# Patient Record
Sex: Male | Born: 1985 | Race: Black or African American | Hispanic: No | Marital: Single | Smoking: Current every day smoker
Health system: Southern US, Community
[De-identification: ages and names within clinical notes are randomized; demographics above are authoritative.]

---

## 2004-10-04 ENCOUNTER — Emergency Department: Payer: Self-pay | Admitting: Internal Medicine

## 2005-05-25 ENCOUNTER — Emergency Department: Payer: Self-pay | Admitting: Emergency Medicine

## 2006-03-03 ENCOUNTER — Emergency Department: Payer: Self-pay | Admitting: Emergency Medicine

## 2006-08-27 ENCOUNTER — Emergency Department: Payer: Self-pay | Admitting: Emergency Medicine

## 2007-05-17 ENCOUNTER — Emergency Department: Payer: Self-pay | Admitting: Emergency Medicine

## 2009-05-17 ENCOUNTER — Emergency Department: Payer: Self-pay | Admitting: Emergency Medicine

## 2011-06-06 ENCOUNTER — Emergency Department: Payer: Self-pay | Admitting: Emergency Medicine

## 2011-10-22 ENCOUNTER — Emergency Department: Payer: Self-pay | Admitting: Emergency Medicine

## 2011-12-17 ENCOUNTER — Emergency Department: Payer: Self-pay | Admitting: Emergency Medicine

## 2012-11-04 ENCOUNTER — Emergency Department: Payer: Self-pay | Admitting: Emergency Medicine

## 2013-04-11 ENCOUNTER — Emergency Department: Payer: Self-pay | Admitting: Emergency Medicine

## 2014-01-10 ENCOUNTER — Emergency Department: Payer: Self-pay | Admitting: Emergency Medicine

## 2014-01-25 ENCOUNTER — Emergency Department: Payer: Self-pay | Admitting: Emergency Medicine

## 2014-06-14 ENCOUNTER — Emergency Department: Payer: Self-pay | Admitting: Student

## 2014-09-06 ENCOUNTER — Emergency Department: Payer: Self-pay | Admitting: Emergency Medicine

## 2015-08-17 ENCOUNTER — Encounter: Payer: Self-pay | Admitting: *Deleted

## 2015-08-17 ENCOUNTER — Emergency Department
Admission: EM | Admit: 2015-08-17 | Discharge: 2015-08-17 | Disposition: A | Discharge status: Home / Self Care | Payer: Self-pay | Attending: Emergency Medicine | Admitting: Emergency Medicine

## 2015-08-17 DIAGNOSIS — A6002 Herpesviral infection of other male genital organs: Secondary | ICD-10-CM | POA: Insufficient documentation

## 2015-08-17 DIAGNOSIS — B009 Herpesviral infection, unspecified: Secondary | ICD-10-CM

## 2015-08-17 DIAGNOSIS — A6000 Herpesviral infection of urogenital system, unspecified: Secondary | ICD-10-CM

## 2015-08-17 DIAGNOSIS — F17210 Nicotine dependence, cigarettes, uncomplicated: Secondary | ICD-10-CM | POA: Insufficient documentation

## 2015-08-17 LAB — URINALYSIS COMPLETE WITH MICROSCOPIC (ARMC ONLY)
BILIRUBIN URINE: NEGATIVE
GLUCOSE, UA: NEGATIVE mg/dL
KETONES UR: NEGATIVE mg/dL
Nitrite: NEGATIVE
PH: 6 (ref 5.0–8.0)
Protein, ur: NEGATIVE mg/dL
Specific Gravity, Urine: 1.001 — ABNORMAL LOW (ref 1.005–1.030)

## 2015-08-17 LAB — CHLAMYDIA/NGC RT PCR (ARMC ONLY)
CHLAMYDIA TR: NOT DETECTED
N GONORRHOEAE: NOT DETECTED

## 2015-08-17 MED ORDER — LIDOCAINE 5 % EX OINT: 1.0000 "application " | TOPICAL_OINTMENT | CUTANEOUS | Status: DC | PRN

## 2015-08-17 MED ORDER — VALACYCLOVIR HCL 500 MG PO TABS: 500.0000 mg | ORAL_TABLET | Freq: Three times a day (TID) | ORAL | Status: AC

## 2015-08-17 NOTE — ED Notes (Signed)
Pt c/o rash on penis, not on groin. Pt is unsure if having drainage from penis. Pt c/o pain from rash. Pt states began last night. Pt denies new sexual partner and condom use. Pt endorses dysuria.

## 2015-08-17 NOTE — ED Notes (Signed)
Pt verbalizes understanding of discharge instructions,  

## 2015-08-17 NOTE — ED Notes (Signed)
Provided pt with a cup of water. °

## 2015-08-17 NOTE — ED Notes (Signed)
Katrinka Blazing, PA-C at bedside

## 2015-08-17 NOTE — ED Notes (Signed)
Pt reports rash to penis, noticed yesterday. Pt reports burning sensation when he urinates. Pt denies any other symptom. Pt talks in complete sentenes

## 2015-08-17 NOTE — ED Provider Notes (Signed)
[facility] Emergency Department Provider Note  ____________________________________________  Time seen: Approximately 7:43 PM  I have reviewed the triage vital signs and the nursing notes.   HISTORY  Chief Complaint Rash    HPI Phillip Wallace. is a 30 y.o. male patient complaining of a rash to penis which he noticed yesterday. Patient also states the rash is burning sensation and also has dysuria. Patient denies any urethral discharge. Patient denies new sexual partner and also denies the use of condoms. She rates pain as a 5/10. No palliative measures taken for this complaint.    History reviewed. No pertinent past medical history.  There are no active problems to display for this patient.   History reviewed. No pertinent past surgical history.  Current Outpatient Rx  Name  Route  Sig  Dispense  Refill  . lidocaine (XYLOCAINE) 5 % ointment   Topical   Apply 1 application topically as needed.   35.44 g   0   . valACYclovir (VALTREX) 500 MG tablet   Oral   Take 1 tablet (500 mg total) by mouth 3 (three) times daily.   21 tablet   0     Allergies Review of patient's allergies indicates no known allergies.  History reviewed. No pertinent family history.  Social History Social History  Substance Use Topics  . Smoking status: Current Every Day Smoker    Types: Cigarettes  . Smokeless tobacco: Never Used  . Alcohol Use: Yes    Review of Systems Constitutional: No fever/chills Eyes: No visual changes. ENT: No sore throat. Cardiovascular: Denies chest pain. Respiratory: Denies shortness of breath. Gastrointestinal: No abdominal pain.  No nausea, no vomiting.  No diarrhea.  No constipation. Genitourinary: Positive for dysuria. Musculoskeletal: Negative for back pain. Skin: Positive for rash. Neurological: Negative for headaches, focal weakness or numbness. 0-point ROS otherwise  negative.  ____________________________________________   PHYSICAL EXAM:  VITAL SIGNS: ED Triage Vitals  Enc Vitals Group     BP 08/17/15 1914 131/78 mmHg     Pulse Rate 08/17/15 1914 105     Resp 08/17/15 1914 18     Temp 08/17/15 1914 98.3 F (36.8 C)     Temp Source 08/17/15 1914 Oral     SpO2 08/17/15 1914 98 %     Weight 08/17/15 1914 152 lb (68.947 kg)     Height 08/17/15 1914  (1.702 m)     Head Cir --      Peak Flow --      Pain Score 08/17/15 1915 5     Pain Loc --      Pain Edu? --      Excl. in GC? --     Constitutional: Alert and oriented. Well appearing and in no acute distress. Eyes: Conjunctivae are normal. PERRL. EOMI. Head: Atraumatic. Nose: No congestion/rhinnorhea. Mouth/Throat: Mucous membranes are moist.  Oropharynx non-erythematous. Neck: No stridor.  No cervical spine tenderness to palpation. Hematological/Lymphatic/Immunilogical: No cervical lymphadenopathy. Cardiovascular: Normal rate, regular rhythm. Grossly normal heart sounds.  Good peripheral circulation. Respiratory: Normal respiratory effort.  No retractions. Lungs CTAB. Gastrointestinal: Soft and nontender. No distention. No abdominal bruits. No CVA tenderness. Genitourinary: No visible or expressed urethral discharge Musculoskeletal: No lower extremity tenderness nor edema.  No joint effusions. Neurologic:  Normal speech and language. No gross focal neurologic deficits are appreciated. No gait instability. Skin:  Skin is warm, dry and intact.Multiple vesicle lesions on penis and glan. Psychiatric: Mood and affect are normal.  Speech and behavior are normal.  ____________________________________________   LABS (all labs ordered are listed, but only abnormal results are displayed)  Labs Reviewed  URINALYSIS COMPLETEWITH MICROSCOPIC ([facility] ONLY) - Abnormal; Notable for the following:    Color, Urine COLORLESS (*)    APPearance CLEAR (*)    Specific Gravity, Urine 1.001 (*)    Hgb  urine dipstick 3+ (*)    Leukocytes, UA 1+ (*)    Bacteria, UA RARE (*)    Squamous Epithelial / LPF 0-5 (*)    All other components within normal limits  CHLAMYDIA/NGC RT PCR ([facility] ONLY)   ____________________________________________  EKG   ____________________________________________  RADIOLOGY   ____________________________________________   PROCEDURES  Procedure(s) performed: None  Critical Care performed: No  ____________________________________________   INITIAL IMPRESSION / ASSESSMENT AND PLAN / ED COURSE  Pertinent labs & imaging results that were available during my care of the patient were reviewed by me and considered in my medical decision making (see chart for details).  Genital herpes. Patient given discharge care instructions. Patient given a prescription for Valtrex. Patient advised to follow-up with  [facility] for further evaluation and treatment. Patient advised to have sexual partner evaluated by [facility] health department. ____________________________________________   FINAL CLINICAL IMPRESSION(S) / ED DIAGNOSES  Final diagnoses:  Genital herpes  Herpes simplex type 2 infection      Joni Reining, PA-C 08/17/15 2243  Richardean Canal, MD 08/17/15 2255

## 2016-01-01 ENCOUNTER — Emergency Department
Admission: EM | Admit: 2016-01-01 | Discharge: 2016-01-01 | Disposition: A | Discharge status: Home / Self Care | Payer: Self-pay | Attending: Emergency Medicine | Admitting: Emergency Medicine

## 2016-01-01 ENCOUNTER — Encounter: Payer: Self-pay | Admitting: Emergency Medicine

## 2016-01-01 DIAGNOSIS — Y999 Unspecified external cause status: Secondary | ICD-10-CM | POA: Insufficient documentation

## 2016-01-01 DIAGNOSIS — Y9389 Activity, other specified: Secondary | ICD-10-CM | POA: Insufficient documentation

## 2016-01-01 DIAGNOSIS — F17210 Nicotine dependence, cigarettes, uncomplicated: Secondary | ICD-10-CM | POA: Insufficient documentation

## 2016-01-01 DIAGNOSIS — W260XXA Contact with knife, initial encounter: Secondary | ICD-10-CM | POA: Insufficient documentation

## 2016-01-01 DIAGNOSIS — S51811A Laceration without foreign body of right forearm, initial encounter: Secondary | ICD-10-CM | POA: Insufficient documentation

## 2016-01-01 DIAGNOSIS — Y929 Unspecified place or not applicable: Secondary | ICD-10-CM | POA: Insufficient documentation

## 2016-01-01 MED ORDER — TRAMADOL HCL 50 MG PO TABS: 50.0000 mg | ORAL_TABLET | Freq: Four times a day (QID) | ORAL | Status: DC | PRN

## 2016-01-01 MED ORDER — OXYCODONE-ACETAMINOPHEN 5-325 MG PO TABS
1.0000 | ORAL_TABLET | Freq: Once | ORAL | Status: AC
  Administered 2016-01-01: 1 via ORAL
  Filled 2016-01-01: qty 1

## 2016-01-01 MED ORDER — TETANUS-DIPHTH-ACELL PERTUSSIS 5-2.5-18.5 LF-MCG/0.5 IM SUSP
0.5000 mL | Freq: Once | INTRAMUSCULAR | Status: AC
  Administered 2016-01-01: 0.5 mL via INTRAMUSCULAR
  Filled 2016-01-01: qty 0.5
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
## 2016-01-01 NOTE — Discharge Instructions (Signed)
Laceration Care, Adult  A laceration is a cut that goes through all layers of the skin. The cut also goes into the tissue that is right under the skin. Some cuts heal on their own. Others need to be closed with stitches (sutures), staples, skin adhesive strips, or wound glue. Taking care of your cut lowers your risk of infection and helps your cut to heal better.  HOW TO TAKE CARE OF YOUR CUT  For stitches or staples:  · Keep the wound clean and dry.  · If you were given a bandage (dressing), you should change it at least one time per day or as told by your doctor. You should also change it if it gets wet or dirty.  · Keep the wound completely dry for the first 24 hours or as told by your doctor. After that time, you may take a shower or a bath. However, make sure that the wound is not soaked in water until after the stitches or staples have been removed.  · Clean the wound one time each day or as told by your doctor:    Wash the wound with soap and water.    Rinse the wound with water until all of the soap comes off.    Pat the wound dry with a clean towel. Do not rub the wound.  · After you clean the wound, put a thin layer of antibiotic ointment on it as told by your doctor. This ointment:    Helps to prevent infection.    Keeps the bandage from sticking to the wound.  · Have your stitches or staples removed as told by your doctor.  If your doctor used skin adhesive strips:   · Keep the wound clean and dry.  · If you were given a bandage, you should change it at least one time per day or as told by your doctor. You should also change it if it gets dirty or wet.  · Do not get the skin adhesive strips wet. You can take a shower or a bath, but be careful to keep the wound dry.  · If the wound gets wet, pat it dry with a clean towel. Do not rub the wound.  · Skin adhesive strips fall off on their own. You can trim the strips as the wound heals. Do not remove any strips that are still stuck to the wound. They will  fall off after a while.  If your doctor used wound glue:  · Try to keep your wound dry, but you may briefly wet it in the shower or bath. Do not soak the wound in water, such as by swimming.  · After you take a shower or a bath, gently pat the wound dry with a clean towel. Do not rub the wound.  · Do not do any activities that will make you really sweaty until the skin glue has fallen off on its own.  · Do not apply liquid, cream, or ointment medicine to your wound while the skin glue is still on.  · If you were given a bandage, you should change it at least one time per day or as told by your doctor. You should also change it if it gets dirty or wet.  · If a bandage is placed over the wound, do not let the tape for the bandage touch the skin glue.  · Do not pick at the glue. The skin glue usually stays on for 5-10 days. Then, it   falls off of the skin.  General Instructions   · To help prevent scarring, make sure to cover your wound with sunscreen whenever you are outside after stitches are removed, after adhesive strips are removed, or when wound glue stays in place and the wound is healed. Make sure to wear a sunscreen of at least 30 SPF.  · Take over-the-counter and prescription medicines only as told by your doctor.  · If you were given antibiotic medicine or ointment, take or apply it as told by your doctor. Do not stop using the antibiotic even if your wound is getting better.  · Do not scratch or pick at the wound.  · Keep all follow-up visits as told by your doctor. This is important.  · Check your wound every day for signs of infection. Watch for:    Redness, swelling, or pain.    Fluid, blood, or pus.  · Raise (elevate) the injured area above the level of your heart while you are sitting or lying down, if possible.  GET HELP IF:  · You got a tetanus shot and you have any of these problems at the injection site:    Swelling.    Very bad pain.    Redness.    Bleeding.  · You have a fever.  · A wound that was  closed breaks open.  · You notice a bad smell coming from your wound or your bandage.  · You notice something coming out of the wound, such as wood or glass.  · Medicine does not help your pain.  · You have more redness, swelling, or pain at the site of your wound.  · You have fluid, blood, or pus coming from your wound.  · You notice a change in the color of your skin near your wound.  · You need to change the bandage often because fluid, blood, or pus is coming from the wound.  · You start to have a new rash.  · You start to have numbness around the wound.  GET HELP RIGHT AWAY IF:  · You have very bad swelling around the wound.  · Your pain suddenly gets worse and is very bad.  · You notice painful lumps near the wound or on skin that is anywhere on your body.  · You have a red streak going away from your wound.  · The wound is on your hand or foot and you cannot move a finger or toe like you usually can.  · The wound is on your hand or foot and you notice that your fingers or toes look pale or bluish.     This information is not intended to replace advice given to you by your health care provider. Make sure you discuss any questions you have with your health care provider.     Document Released: 12/15/2007 Document Revised: 11/12/2014 Document Reviewed: 06/24/2014  Elsevier Interactive Patient Education ©2016 [employer]

## 2016-01-14 ENCOUNTER — Emergency Department
Admission: EM | Admit: 2016-01-14 | Discharge: 2016-01-14 | Disposition: A | Discharge status: Home / Self Care | Payer: PRIVATE HEALTH INSURANCE | Attending: Emergency Medicine | Admitting: Emergency Medicine

## 2016-01-14 ENCOUNTER — Encounter: Payer: Self-pay | Admitting: Emergency Medicine

## 2016-01-14 DIAGNOSIS — Z4802 Encounter for removal of sutures: Secondary | ICD-10-CM | POA: Insufficient documentation

## 2016-01-14 DIAGNOSIS — F17210 Nicotine dependence, cigarettes, uncomplicated: Secondary | ICD-10-CM | POA: Diagnosis not present

## 2016-01-14 NOTE — Discharge Instructions (Signed)

## 2016-01-14 NOTE — ED Notes (Signed)
Needs sutures removed  

## 2016-01-14 NOTE — ED Notes (Signed)
Total of 9 sutures were removed from the patients right forearm.

## 2016-01-14 NOTE — ED Notes (Signed)
Pt not in room left before being discharged

## 2016-01-14 NOTE — ED Provider Notes (Signed)
[facility] Emergency Department Provider Note   ____________________________________________  Time seen: Approximately 2:05 PM  I have reviewed the triage vital signs and the nursing notes.   HISTORY  Chief Complaint Suture / Staple Removal    HPI Phillip MangleWilliam E Ricciuti Jr. is a 30 y.o. male patient here today for suture removal status post laceration right forearm. Patient had a sutures placed 10 days ago. Patient state  no complication status post procedure. No other palliative measures taken for this complaint.Patient stated no pain at this time. Patient denies any loss of function loss of sensation. Patient is right-hand dominant.   History reviewed. No pertinent past medical history.  There are no active problems to display for this patient.   History reviewed. No pertinent past surgical history.  Current Outpatient Rx  Name  Route  Sig  Dispense  Refill  . lidocaine (XYLOCAINE) 5 % ointment   Topical   Apply 1 application topically as needed.   35.44 g   0   . traMADol (ULTRAM) 50 MG tablet   Oral   Take 1 tablet (50 mg total) by mouth every 6 (six) hours as needed for moderate pain.   12 tablet   0     Allergies Review of patient's allergies indicates no known allergies.  No family history on file.  Social History Social History  Substance Use Topics  . Smoking status: Current Every Day Smoker    Types: Cigarettes  . Smokeless tobacco: Never Used  . Alcohol Use: Yes    Review of Systems Constitutional: No fever/chills Eyes: No visual changes. ENT: No sore throat. Cardiovascular: Denies chest pain. Respiratory: Denies shortness of breath. Gastrointestinal: No abdominal pain.  No nausea, no vomiting.  No diarrhea.  No constipation. Genitourinary: Negative for dysuria. Musculoskeletal: Negative for back pain. Skin: Negative for rash. Healing laceration Neurological: Negative for headaches, focal weakness or  numbness.   ____________________________________________   PHYSICAL EXAM:  VITAL SIGNS: ED Triage Vitals  Enc Vitals Group     BP --      Pulse --      Resp --      Temp --      Temp src --      SpO2 --      Weight --      Height --      Head Cir --      Peak Flow --      Pain Score 01/14/16 1338 0     Pain Loc --      Pain Edu? --      Excl. in GC? --     Constitutional: Alert and oriented. Well appearing and in no acute distress. Eyes: Conjunctivae are normal. PERRL. EOMI. Head: Atraumatic. Nose: No congestion/rhinnorhea. Mouth/Throat: Mucous membranes are moist.  Oropharynx non-erythematous. Neck: No stridor.  No cervical spine tenderness to palpation. Hematological/Lymphatic/Immunilogical: No cervical lymphadenopathy. Cardiovascular: Normal rate, regular rhythm. Grossly normal heart sounds.  Good peripheral circulation. Respiratory: Normal respiratory effort.  No retractions. Lungs CTAB. Gastrointestinal: Soft and nontender. No distention. No abdominal bruits. No CVA tenderness. Musculoskeletal: No lower extremity tenderness nor edema.  No joint effusions. Neurologic:  Normal speech and language. No gross focal neurologic deficits are appreciated. No gait instability. Skin:  Skin is warm, dry and intact. No rash noted. Psychiatric: Mood and affect are normal. Speech and behavior are normal.  ____________________________________________   LABS (all labs ordered are listed, but only abnormal results are displayed)  Labs Reviewed - No data to display ____________________________________________  EKG   ____________________________________________  RADIOLOGY   ____________________________________________   PROCEDURES  Procedure(s) performed: None  Procedures  Critical Care performed: No  ____________________________________________   INITIAL IMPRESSION / ASSESSMENT AND PLAN / ED COURSE  Pertinent labs & imaging results that were available  during my care of the patient were reviewed by me and considered in my medical decision making (see chart for details).  Healed laceration requiring suture removal. A sutures were removed and sterile strips applied. Patient given discharge care instructions. Patient advised return back to the ER for this wound reopens. ____________________________________________   FINAL CLINICAL IMPRESSION(S) / ED DIAGNOSES  Final diagnoses:  Encounter for removal of sutures      NEW MEDICATIONS STARTED DURING THIS VISIT:  New Prescriptions   No medications on file     Note:  This document was prepared using Dragon voice recognition software and may include unintentional dictation errors.    Phillip ReiningRonald K Annalisia Ingber, PA-C 01/14/16 1415  Nita Sicklearolina Veronese, MD 01/14/16 2052

## 2016-03-13 ENCOUNTER — Emergency Department
Admission: EM | Admit: 2016-03-13 | Discharge: 2016-03-13 | Disposition: A | Discharge status: Home / Self Care | Payer: PRIVATE HEALTH INSURANCE | Attending: Emergency Medicine | Admitting: Emergency Medicine

## 2016-03-13 ENCOUNTER — Encounter: Payer: Self-pay | Admitting: Emergency Medicine

## 2016-03-13 DIAGNOSIS — M62838 Other muscle spasm: Secondary | ICD-10-CM | POA: Diagnosis not present

## 2016-03-13 DIAGNOSIS — M542 Cervicalgia: Secondary | ICD-10-CM | POA: Diagnosis present

## 2016-03-13 DIAGNOSIS — F17210 Nicotine dependence, cigarettes, uncomplicated: Secondary | ICD-10-CM | POA: Insufficient documentation

## 2016-03-13 MED ORDER — METHOCARBAMOL 500 MG PO TABS: 500.0000 mg | ORAL_TABLET | Freq: Four times a day (QID) | ORAL | 0 refills | Status: DC

## 2016-03-13 MED ORDER — MELOXICAM 15 MG PO TABS: 15.0000 mg | ORAL_TABLET | Freq: Every day | ORAL | 0 refills | Status: DC

## 2016-03-13 NOTE — ED Notes (Signed)
Pt ambulatory to room w/o issue, denies CP, SOB, n/v/d.  No LOC or dizziness.
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
## 2016-10-22 ENCOUNTER — Emergency Department
Admission: EM | Admit: 2016-10-22 | Discharge: 2016-10-22 | Disposition: A | Discharge status: Home / Self Care | Payer: Self-pay | Attending: Emergency Medicine | Admitting: Emergency Medicine

## 2016-10-22 ENCOUNTER — Emergency Department: Payer: Self-pay

## 2016-10-22 ENCOUNTER — Encounter: Payer: Self-pay | Admitting: Emergency Medicine

## 2016-10-22 DIAGNOSIS — Z23 Encounter for immunization: Secondary | ICD-10-CM | POA: Insufficient documentation

## 2016-10-22 DIAGNOSIS — S0083XA Contusion of other part of head, initial encounter: Secondary | ICD-10-CM

## 2016-10-22 DIAGNOSIS — R52 Pain, unspecified: Secondary | ICD-10-CM

## 2016-10-22 DIAGNOSIS — H1132 Conjunctival hemorrhage, left eye: Secondary | ICD-10-CM | POA: Insufficient documentation

## 2016-10-22 DIAGNOSIS — Y999 Unspecified external cause status: Secondary | ICD-10-CM | POA: Insufficient documentation

## 2016-10-22 DIAGNOSIS — W500XXA Accidental hit or strike by another person, initial encounter: Secondary | ICD-10-CM | POA: Insufficient documentation

## 2016-10-22 DIAGNOSIS — F17210 Nicotine dependence, cigarettes, uncomplicated: Secondary | ICD-10-CM | POA: Insufficient documentation

## 2016-10-22 DIAGNOSIS — Y929 Unspecified place or not applicable: Secondary | ICD-10-CM | POA: Insufficient documentation

## 2016-10-22 DIAGNOSIS — S0012XA Contusion of left eyelid and periocular area, initial encounter: Secondary | ICD-10-CM | POA: Insufficient documentation

## 2016-10-22 DIAGNOSIS — Y939 Activity, unspecified: Secondary | ICD-10-CM | POA: Insufficient documentation

## 2016-10-22 MED ORDER — TETANUS-DIPHTH-ACELL PERTUSSIS 5-2.5-18.5 LF-MCG/0.5 IM SUSP
0.5000 mL | Freq: Once | INTRAMUSCULAR | Status: AC
  Administered 2016-10-22: 0.5 mL via INTRAMUSCULAR

## 2016-10-22 MED ORDER — TRAMADOL HCL 50 MG PO TABS: 50.0000 mg | ORAL_TABLET | Freq: Four times a day (QID) | ORAL | 0 refills | Status: DC | PRN

## 2016-10-22 MED ORDER — TETANUS-DIPHTH-ACELL PERTUSSIS 5-2.5-18.5 LF-MCG/0.5 IM SUSP
INTRAMUSCULAR | Status: AC
  Filled 2016-10-22: qty 0.5

## 2016-10-22 NOTE — Discharge Instructions (Signed)
Ice to face to reduce swelling. Take tramadol only as needed for severe pain. He may take Tylenol as needed for discomfort. Follow-up with [facility] clinic or your primary care doctor if any continued problems.
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
## 2018-04-26 ENCOUNTER — Emergency Department (HOSPITAL_COMMUNITY)
Admission: EM | Admit: 2018-04-26 | Discharge: 2018-04-27 | Disposition: A | Discharge status: Home / Self Care | Payer: PRIVATE HEALTH INSURANCE | Attending: Emergency Medicine | Admitting: Emergency Medicine

## 2018-04-26 ENCOUNTER — Encounter (HOSPITAL_COMMUNITY): Payer: Self-pay | Admitting: Emergency Medicine

## 2018-04-26 ENCOUNTER — Emergency Department (HOSPITAL_COMMUNITY): Payer: PRIVATE HEALTH INSURANCE

## 2018-04-26 DIAGNOSIS — R0789 Other chest pain: Secondary | ICD-10-CM

## 2018-04-26 DIAGNOSIS — F17210 Nicotine dependence, cigarettes, uncomplicated: Secondary | ICD-10-CM | POA: Insufficient documentation

## 2018-04-26 LAB — CBC
HCT: 48.7 % (ref 39.0–52.0)
HEMOGLOBIN: 16.2 g/dL (ref 13.0–17.0)
MCH: 29.2 pg (ref 26.0–34.0)
MCHC: 33.3 g/dL (ref 30.0–36.0)
MCV: 87.9 fL (ref 80.0–100.0)
PLATELETS: 357 10*3/uL (ref 150–400)
RBC: 5.54 MIL/uL (ref 4.22–5.81)
RDW: 13.5 % (ref 11.5–15.5)
WBC: 8.7 10*3/uL (ref 4.0–10.5)
nRBC: 0 % (ref 0.0–0.2)

## 2018-04-26 LAB — BASIC METABOLIC PANEL
ANION GAP: 16 — AB (ref 5–15)
BUN: 9 mg/dL (ref 6–20)
CALCIUM: 9.7 mg/dL (ref 8.9–10.3)
CO2: 26 mmol/L (ref 22–32)
Chloride: 97 mmol/L — ABNORMAL LOW (ref 98–111)
Creatinine, Ser: 1.24 mg/dL (ref 0.61–1.24)
GFR calc Af Amer: >60 mL/min (ref >60)
GFR calc non Af Amer: >60 mL/min (ref >60)
Glucose, Bld: 84 mg/dL (ref 70–99)
POTASSIUM: 3.9 mmol/L (ref 3.5–5.1)
SODIUM: 139 mmol/L (ref 135–145)

## 2018-04-26 LAB — I-STAT TROPONIN, ED: Troponin i, poc: 0.01 ng/mL (ref 0.00–0.08)

## 2018-04-26 NOTE — ED Triage Notes (Signed)
Patient reports central chest pain with mild SOB and productive cough onset this evening , no emesis or diaphoresis .

## 2018-04-26 NOTE — ED Provider Notes (Signed)
[facility] EMERGENCY DEPARTMENT Provider Note   CSN: [id number] Arrival date & time: 04/26/18  2143     History   Chief Complaint Chief Complaint  Patient presents with  . Chest Pain    HPI Phillip Wallace. is a 32 y.o. male with no known medical history who presents for evaluation of acute onset chest pain. It started when he was sitting at home. Described as sharp, stabbing pain in the center of his chest, worse with deep breaths, and with associated dizziness. The pain is intermittent and will last for 3-4 minutes at a time. He denies associated sob, n/v, cough, diaphoresis. He does not right upper back pain yesterday but that has resolved today.   He smokes 1 pack every 2 days. Drinks 1-2 beers a day. Denies illicit drug use. Denies recent travel, surgeries, illness, or trauma. Denies family history of blood clots.   HPI  History reviewed. No pertinent past medical history.  There are no active problems to display for this patient.   History reviewed. No pertinent surgical history.      Home Medications    Prior to Admission medications   Medication Sig Start Date End Date Taking? Authorizing Provider  lidocaine (XYLOCAINE) 5 % ointment Apply 1 application topically as needed. Patient not taking: Reported on 04/26/2018 08/17/15   Joni Reining, PA-C  traMADol (ULTRAM) 50 MG tablet Take 1 tablet (50 mg total) by mouth every 6 (six) hours as needed. Patient not taking: Reported on 04/26/2018 10/22/16   Tommi Rumps, PA-C    Family History No family history on file.  Social History Social History   Tobacco Use  . Smoking status: Current Every Day Smoker    Packs/day: 0.50    Types: Cigarettes  . Smokeless tobacco: Never Used  Substance Use Topics  . Alcohol use: Yes  . Drug use: No     Allergies   Patient has no known allergies.   Review of Systems Review of Systems See HPI  Physical Exam Updated Vital Signs BP 137/84    Pulse 92   Temp 97.7 F (36.5 C) (Oral)   Resp 18   Ht 5\' 8"  (1.727 m)   Wt 68 kg   SpO2 99%   BMI 22.81 kg/m   Physical Exam Vitals:   04/26/18 2230 04/26/18 2245 04/26/18 2300 04/26/18 2315  BP: 137/90 (!) 140/94 (!) 141/97 137/84  Pulse: 98 (!) 102 92 92  Resp: 16 (!) 21 18 18   Temp:      TempSrc:      SpO2: 100% 100% 98% 99%  Weight:      Height:       General: Vital signs reviewed.  Patient is well-developed and well-nourished, in no acute distress and cooperative with exam.  Head: Normocephalic and atraumatic. Eyes: EOMI, conjunctivae normal, no scleral icterus. PERRL Neck: Supple, trachea midline, normal ROM, no JVD, masses, thyromegaly, or carotid bruit present.  Cardiovascular: RRR, S1 normal, S2 normal, S3 heart sound appreciated. no murmurs, gallops, or rubs. Chest pain is not reproducible on exam and no trauma, swelling or erythema is appreciated on chest wall.  Pulmonary/Chest: Decreased breath sounds on right posterior lung fields, no wheezes, rales, or rhonchi. Abdominal: Soft, non-tender, non-distended, BS + Musculoskeletal: No joint deformities, erythema, or stiffness, ROM full and nontender. Extremities: No lower extremity edema bilaterally,  pulses symmetric and intact bilaterally. No cyanosis or clubbing. Neurological: A&O x3, Strength is normal and symmetric bilaterally, cranial  nerve II-XII are grossly intact, no focal motor deficit, sensory intact to light touch bilaterally.  Skin: Warm, dry and intact. No rashes or erythema. Psychiatric: Normal mood and affect. speech and behavior is normal. Cognition and memory are normal.    ED Treatments / Results  Labs (all labs ordered are listed, but only abnormal results are displayed) Labs Reviewed  BASIC METABOLIC PANEL - Abnormal; Notable for the following components:      Result Value   Chloride 97 (*)    Anion gap 16 (*)    All other components within normal limits  CBC  D-DIMER, QUANTITATIVE (NOT AT  [facility])  I-STAT TROPONIN, ED    EKG EKG Interpretation  Date/Time:  Wednesday April 26 2018 21:46:24 EDT Ventricular Rate:  102 PR Interval:  136 QRS Duration: 90 QT Interval:  352 QTC Calculation: 458 R Axis:   92 Text Interpretation:  Sinus tachycardia Right atrial enlargement Rightward axis No previous tracing Confirmed by Gwyneth Sprout ([id number]) on 04/26/2018 10:34:48 PM   Radiology Dg Chest 2 View  Result Date: 04/26/2018 CLINICAL DATA:  Chest pain and short of breath EXAM: CHEST - 2 VIEW COMPARISON:  05/25/2005 FINDINGS: The heart size and mediastinal contours are within normal limits. Both lungs are clear. The visualized skeletal structures are unremarkable. IMPRESSION: No active cardiopulmonary disease. Electronically Signed   By: Jasmine Pang M.D.   On: 04/26/2018 22:18    Procedures Procedures (including critical care time)  Medications Ordered in ED Medications - No data to display   Initial Impression / Assessment and Plan / ED Course  I have reviewed the triage vital signs and the nursing notes.  Pertinent labs & imaging results that were available during my care of the patient were reviewed by me and considered in my medical decision making (see chart for details).   31yo male, otherwise healthy, presents with acute onset stabbing chest pain, intermittent, with associated dizziness. The pain comes randomly and first occurred today while at rest. The pain is worse with deep breaths and lasts 3-4 minutes at a time. He is tachycardic. +tobacco use. Concern for PE. Will get d-dimer. EKG and troponin not consistent with ischemic changes. CBC and BMP within normal limits. He is hypertensive but chest pain is not "tearing" in nature nor does it radiate to his back and radial pulses are strong and symemtric. Doubt aortic dissection.   D-dimer is pending. Patient appears to have left the ED. His gown and cardiac leads are laying on his bed and the patient is nowhere to  be found. He appears to have left without notifying anyone.  D-dimer is negative. Patient has eloped.      Final Clinical Impressions(s) / ED Diagnoses   Final diagnoses:  Atypical chest pain    ED Discharge Orders    None       Ali Lowe, MD 04/27/18 Burna Mortimer    Gwyneth Sprout, MD 04/29/18 2139

## 2018-04-27 LAB — D-DIMER, QUANTITATIVE (NOT AT ARMC)

## 2018-04-27 NOTE — ED Notes (Signed)
Unable to locate pt x2 in room

## 2020-12-21 ENCOUNTER — Ambulatory Visit (INDEPENDENT_AMBULATORY_CARE_PROVIDER_SITE_OTHER): Payer: PRIVATE HEALTH INSURANCE

## 2020-12-21 ENCOUNTER — Other Ambulatory Visit: Payer: Self-pay

## 2020-12-21 ENCOUNTER — Ambulatory Visit (HOSPITAL_COMMUNITY)
Admission: EM | Admit: 2020-12-21 | Discharge: 2020-12-21 | Disposition: A | Discharge status: Home / Self Care | Payer: Self-pay | Attending: Emergency Medicine | Admitting: Emergency Medicine

## 2020-12-21 ENCOUNTER — Encounter (HOSPITAL_COMMUNITY): Payer: Self-pay | Admitting: Emergency Medicine

## 2020-12-21 DIAGNOSIS — M79641 Pain in right hand: Secondary | ICD-10-CM | POA: Diagnosis not present

## 2020-12-21 DIAGNOSIS — S62306A Unspecified fracture of fifth metacarpal bone, right hand, initial encounter for closed fracture: Secondary | ICD-10-CM

## 2020-12-21 NOTE — ED Provider Notes (Signed)
[facility]    CSN: [id number] Arrival date & time: 12/21/20  1659      History   Chief Complaint Chief Complaint  Patient presents with   Hand Pain    HPI Phillip Wallace. is a 35 y.o. male.   Patient here for evaluation of right hand swelling and pain after hitting a wall approximately 1 week ago.  Denies any numbness or tingling to his fingers.  Reports using ice for comfort and swelling.  Has not taken any OTC medications.   Denies any fevers, chest pain, shortness of breath, N/V/D, numbness, tingling, weakness, abdominal pain, or headaches.     The history is provided by the patient.  Hand Pain   History reviewed. No pertinent past medical history.  There are no problems to display for this patient.   History reviewed. No pertinent surgical history.     Home Medications    Prior to Admission medications   Medication Sig Start Date End Date Taking? Authorizing Provider  lidocaine (XYLOCAINE) 5 % ointment Apply 1 application topically as needed. Patient not taking: Reported on 04/26/2018 08/17/15   Joni Reining, PA-C  traMADol (ULTRAM) 50 MG tablet Take 1 tablet (50 mg total) by mouth every 6 (six) hours as needed. Patient not taking: Reported on 04/26/2018 10/22/16   Tommi Rumps, PA-C    Family History History reviewed. No pertinent family history.  Social History Social History   Tobacco Use   Smoking status: Every Day    Packs/day: 0.50    Pack years: 0.00    Types: Cigarettes   Smokeless tobacco: Never  Substance Use Topics   Alcohol use: Yes   Drug use: No     Allergies   Patient has no known allergies.   Review of Systems Review of Systems  Musculoskeletal:  Positive for arthralgias and joint swelling.  All other systems reviewed and are negative.   Physical Exam Triage Vital Signs ED Triage Vitals  Enc Vitals Group     BP 12/21/20 1721 135/80     Pulse Rate 12/21/20 1721 88     Resp 12/21/20 1721 16     Temp  12/21/20 1721 98.1 F (36.7 C)     Temp Source 12/21/20 1721 Oral     SpO2 12/21/20 1721 100 %     Weight --      Height --      Head Circumference --      Peak Flow --      Pain Score 12/21/20 1718 8     Pain Loc --      Pain Edu? --      Excl. in GC? --    No data found.  Updated Vital Signs BP 135/80 (BP Location: Right Arm)   Pulse 88   Temp 98.1 F (36.7 C) (Oral)   Resp 16   SpO2 100%   Visual Acuity Right Eye Distance:   Left Eye Distance:   Bilateral Distance:    Right Eye Near:   Left Eye Near:    Bilateral Near:     Physical Exam Vitals and nursing note reviewed.  Constitutional:      General: He is not in acute distress.    Appearance: Normal appearance. He is not ill-appearing, toxic-appearing or diaphoretic.  HENT:     Head: Normocephalic and atraumatic.  Eyes:     Conjunctiva/sclera: Conjunctivae normal.  Cardiovascular:     Rate and Rhythm: Normal rate.  Pulses: Normal pulses.  Pulmonary:     Effort: Pulmonary effort is normal.  Abdominal:     General: Abdomen is flat.  Musculoskeletal:     Right hand: Swelling, deformity, tenderness and bony tenderness present. Decreased range of motion. Normal strength. Normal sensation. There is no disruption of two-point discrimination. Normal capillary refill. Normal pulse.     Cervical back: Normal range of motion.  Skin:    General: Skin is warm and dry.  Neurological:     General: No focal deficit present.     Mental Status: He is alert and oriented to person, place, and time.  Psychiatric:        Mood and Affect: Mood normal.     UC Treatments / Results  Labs (all labs ordered are listed, but only abnormal results are displayed) Labs Reviewed - No data to display  EKG   Radiology DG Hand Complete Right  Result Date: 12/21/2020 CLINICAL DATA:  Injured right hand 1 week ago. EXAM: RIGHT HAND - COMPLETE 3+ VIEW COMPARISON:  None. FINDINGS: There is a long oblique fracture involving the  distal shaft and lower neck region of the fifth metacarpal with significant apex dorsal angulation. The joint spaces are maintained.  No other fractures are identified. IMPRESSION: Angulated oblique fracture of the fifth metacarpal Electronically Signed   By: Rudie Meyer M.D.   On: 12/21/2020 17:38    Procedures Procedures (including critical care time)  Medications Ordered in UC Medications - No data to display  Initial Impression / Assessment and Plan / UC Course  I have reviewed the triage vital signs and the nursing notes.  Pertinent labs & imaging results that were available during my care of the patient were reviewed by me and considered in my medical decision making (see chart for details).    X-ray shows angulated oblique fracture of the fifth metacarpal.  Gutter ulnar splint applied in office.  Patient instructed to follow-up with Dr. Eulah Pont at [facility] first thing in the morning.  May take Tylenol and/or ibuprofen as needed.  Rest.  May continue to ice as needed for comfort.  Patient was instructed to go to the emergency room for any numbness, tingling, or if the fingers become cool to the touch. Final Clinical Impressions(s) / UC Diagnoses   Final diagnoses:  Closed displaced fracture of fifth metacarpal bone of right hand, unspecified portion of metacarpal, initial encounter     Discharge Instructions      Call [phone] (Dr. Eulah Pont) tomorrow to set up an appointment with Dr. Eulah Pont first thing in the morning.    You can take Tylenol and/or Ibuprofen as needed for pain and swelling.    Rest as much as possible Ice for 10-15 minutes every 4-6 hours as needed for pain and swelling Elevate above your hip/heart when sitting and laying down  If you develop any numbness, tingling, your fingers become cool to the touch, go to the Emergency Department for further evaluation.      ED Prescriptions   None    PDMP not reviewed this encounter.   Ivette Loyal,  NP 12/21/20 1819

## 2020-12-21 NOTE — Discharge Instructions (Addendum)
Call [phone] (Dr. Eulah Pont) tomorrow to set up an appointment with Dr. Eulah Pont first thing in the morning.    You can take Tylenol and/or Ibuprofen as needed for pain and swelling.    Rest as much as possible Ice for 10-15 minutes every 4-6 hours as needed for pain and swelling Elevate above your hip/heart when sitting and laying down  If you develop any numbness, tingling, your fingers become cool to the touch, go to the Emergency Department for further evaluation.

## 2020-12-21 NOTE — ED Triage Notes (Signed)
Pt presents with right hand injury and swelling after hitting a wall approx 1 week ago while playing with children.

## 2021-01-26 ENCOUNTER — Other Ambulatory Visit: Payer: Self-pay | Admitting: Orthopedic Surgery

## 2021-01-28 ENCOUNTER — Encounter (HOSPITAL_BASED_OUTPATIENT_CLINIC_OR_DEPARTMENT_OTHER): Payer: Self-pay | Admitting: Orthopedic Surgery

## 2021-01-28 ENCOUNTER — Other Ambulatory Visit: Payer: Self-pay

## 2021-02-04 ENCOUNTER — Encounter (HOSPITAL_BASED_OUTPATIENT_CLINIC_OR_DEPARTMENT_OTHER)
Admission: RE | Disposition: A | Discharge status: Home / Self Care | Payer: Self-pay | Source: Home / Self Care | Attending: Orthopedic Surgery

## 2021-02-04 ENCOUNTER — Ambulatory Visit (HOSPITAL_BASED_OUTPATIENT_CLINIC_OR_DEPARTMENT_OTHER)
Admission: RE | Admit: 2021-02-04 | Discharge: 2021-02-04 | Disposition: A | Discharge status: Home / Self Care | Payer: Self-pay | Attending: Orthopedic Surgery | Admitting: Orthopedic Surgery

## 2021-02-04 ENCOUNTER — Encounter (HOSPITAL_BASED_OUTPATIENT_CLINIC_OR_DEPARTMENT_OTHER): Payer: Self-pay | Admitting: Orthopedic Surgery

## 2021-02-04 ENCOUNTER — Ambulatory Visit (HOSPITAL_BASED_OUTPATIENT_CLINIC_OR_DEPARTMENT_OTHER): Payer: Self-pay | Admitting: Anesthesiology

## 2021-02-04 ENCOUNTER — Other Ambulatory Visit: Payer: Self-pay

## 2021-02-04 DIAGNOSIS — Y939 Activity, unspecified: Secondary | ICD-10-CM | POA: Insufficient documentation

## 2021-02-04 DIAGNOSIS — S62326A Displaced fracture of shaft of fifth metacarpal bone, right hand, initial encounter for closed fracture: Secondary | ICD-10-CM | POA: Insufficient documentation

## 2021-02-04 DIAGNOSIS — X58XXXA Exposure to other specified factors, initial encounter: Secondary | ICD-10-CM | POA: Insufficient documentation

## 2021-02-04 DIAGNOSIS — F17210 Nicotine dependence, cigarettes, uncomplicated: Secondary | ICD-10-CM | POA: Insufficient documentation

## 2021-02-04 HISTORY — PX: OPEN REDUCTION INTERNAL FIXATION (ORIF) METACARPAL: SHX6234

## 2021-02-04 SURGERY — OPEN REDUCTION INTERNAL FIXATION (ORIF) METACARPAL
Anesthesia: Regional | Site: Finger | Laterality: Right

## 2021-02-04 MED ORDER — LACTATED RINGERS IV SOLN: INTRAVENOUS | Status: DC

## 2021-02-04 MED ORDER — MIDAZOLAM HCL 2 MG/2ML IJ SOLN
2.0000 mg | Freq: Once | INTRAMUSCULAR | Status: AC
  Administered 2021-02-04: 2 mg via INTRAVENOUS

## 2021-02-04 MED ORDER — FENTANYL CITRATE (PF) 100 MCG/2ML IJ SOLN
INTRAMUSCULAR | Status: AC
  Filled 2021-02-04: qty 2

## 2021-02-04 MED ORDER — MIDAZOLAM HCL 2 MG/2ML IJ SOLN
INTRAMUSCULAR | Status: AC
  Filled 2021-02-04: qty 2

## 2021-02-04 MED ORDER — FENTANYL CITRATE (PF) 100 MCG/2ML IJ SOLN: 25.0000 ug | INTRAMUSCULAR | Status: DC | PRN

## 2021-02-04 MED ORDER — MIDAZOLAM HCL 5 MG/5ML IJ SOLN
INTRAMUSCULAR | Status: DC | PRN
  Administered 2021-02-04: 2 mg via INTRAVENOUS

## 2021-02-04 MED ORDER — OXYCODONE-ACETAMINOPHEN 5-325 MG PO TABS: 1.0000 | ORAL_TABLET | ORAL | 0 refills | Status: AC | PRN

## 2021-02-04 MED ORDER — LIDOCAINE HCL (PF) 2 % IJ SOLN
INTRAMUSCULAR | Status: AC
  Filled 2021-02-04: qty 5

## 2021-02-04 MED ORDER — PROPOFOL 500 MG/50ML IV EMUL
INTRAVENOUS | Status: AC
  Filled 2021-02-04: qty 50

## 2021-02-04 MED ORDER — OXYCODONE HCL 5 MG PO TABS: 5.0000 mg | ORAL_TABLET | Freq: Once | ORAL | Status: DC | PRN

## 2021-02-04 MED ORDER — CEFAZOLIN SODIUM-DEXTROSE 2-4 GM/100ML-% IV SOLN
2.0000 g | INTRAVENOUS | Status: AC
  Administered 2021-02-04: 2 g via INTRAVENOUS

## 2021-02-04 MED ORDER — CEFAZOLIN SODIUM-DEXTROSE 2-4 GM/100ML-% IV SOLN
INTRAVENOUS | Status: AC
  Filled 2021-02-04: qty 100

## 2021-02-04 MED ORDER — FENTANYL CITRATE (PF) 100 MCG/2ML IJ SOLN
INTRAMUSCULAR | Status: DC | PRN
  Administered 2021-02-04: 50 ug via INTRAVENOUS

## 2021-02-04 MED ORDER — BUPIVACAINE-EPINEPHRINE (PF) 0.5% -1:200000 IJ SOLN
INTRAMUSCULAR | Status: DC | PRN
  Administered 2021-02-04: 30 mL via PERINEURAL

## 2021-02-04 MED ORDER — PROMETHAZINE HCL 25 MG/ML IJ SOLN: 6.2500 mg | INTRAMUSCULAR | Status: DC | PRN

## 2021-02-04 MED ORDER — ONDANSETRON HCL 4 MG/2ML IJ SOLN
INTRAMUSCULAR | Status: DC | PRN
  Administered 2021-02-04: 4 mg via INTRAVENOUS

## 2021-02-04 MED ORDER — OXYCODONE HCL 5 MG/5ML PO SOLN: 5.0000 mg | Freq: Once | ORAL | Status: DC | PRN

## 2021-02-04 MED ORDER — FENTANYL CITRATE (PF) 100 MCG/2ML IJ SOLN
100.0000 ug | Freq: Once | INTRAMUSCULAR | Status: AC
  Administered 2021-02-04: 50 ug via INTRAVENOUS

## 2021-02-04 MED ORDER — ONDANSETRON HCL 4 MG/2ML IJ SOLN
INTRAMUSCULAR | Status: AC
  Filled 2021-02-04: qty 2

## 2021-02-04 MED ORDER — PROPOFOL 10 MG/ML IV BOLUS
INTRAVENOUS | Status: DC | PRN
  Administered 2021-02-04 (×2): 40 mg via INTRAVENOUS
  Administered 2021-02-04: 10 mg via INTRAVENOUS
  Administered 2021-02-04: 20 mg via INTRAVENOUS
  Administered 2021-02-04: 40 mg via INTRAVENOUS

## 2021-02-04 SURGICAL SUPPLY — 65 items
APL SKNCLS STERI-STRIP NONHPOA (GAUZE/BANDAGES/DRESSINGS) ×1
BENZOIN TINCTURE PRP APPL 2/3 (GAUZE/BANDAGES/DRESSINGS) ×2 IMPLANT
BIT DRILL 1.1X60MM (BIT) ×1 IMPLANT
BLADE SURG 15 STRL LF DISP TIS (BLADE) ×2 IMPLANT
BLADE SURG 15 STRL SS (BLADE) ×4
BNDG CMPR 9X4 STRL LF SNTH (GAUZE/BANDAGES/DRESSINGS) ×1
BNDG ELASTIC 2X5.8 VLCR STR LF (GAUZE/BANDAGES/DRESSINGS) IMPLANT
BNDG ELASTIC 3X5.8 VLCR STR LF (GAUZE/BANDAGES/DRESSINGS) ×2 IMPLANT
BNDG ELASTIC 4X5.8 VLCR STR LF (GAUZE/BANDAGES/DRESSINGS) IMPLANT
BNDG ESMARK 4X9 LF (GAUZE/BANDAGES/DRESSINGS) ×2 IMPLANT
BNDG GAUZE ELAST 4 BULKY (GAUZE/BANDAGES/DRESSINGS) ×2 IMPLANT
CANISTER SUCT 1200ML W/VALVE (MISCELLANEOUS) ×2 IMPLANT
CORD BIPOLAR FORCEPS 12FT (ELECTRODE) ×2 IMPLANT
COVER BACK TABLE 60X90IN (DRAPES) ×2 IMPLANT
CUFF TOURN SGL QUICK 18X4 (TOURNIQUET CUFF) ×2 IMPLANT
DECANTER SPIKE VIAL GLASS SM (MISCELLANEOUS) IMPLANT
DRAPE EXTREMITY T 121X128X90 (DISPOSABLE) ×2 IMPLANT
DRAPE OEC MINIVIEW 54X84 (DRAPES) ×2 IMPLANT
DRAPE SURG 17X23 STRL (DRAPES) ×2 IMPLANT
DRILL 1.1X60MM (BIT) ×2
DURAPREP 26ML APPLICATOR (WOUND CARE) ×2 IMPLANT
GAUZE 4X4 16PLY ~~LOC~~+RFID DBL (SPONGE) ×2 IMPLANT
GAUZE SPONGE 4X4 12PLY STRL (GAUZE/BANDAGES/DRESSINGS) ×2 IMPLANT
GAUZE XEROFORM 1X8 LF (GAUZE/BANDAGES/DRESSINGS) ×2 IMPLANT
GLOVE SURG POLYISO LF SZ7 (GLOVE) ×2 IMPLANT
GLOVE SURG SYN 8.0 (GLOVE) ×2 IMPLANT
GLOVE SURG UNDER POLY LF SZ7 (GLOVE) ×2 IMPLANT
GOWN STRL REIN XL XLG (GOWN DISPOSABLE) ×4 IMPLANT
GOWN STRL REUS W/ TWL LRG LVL3 (GOWN DISPOSABLE) ×1 IMPLANT
GOWN STRL REUS W/TWL LRG LVL3 (GOWN DISPOSABLE) ×2
NEEDLE HYPO 25X1 1.5 SAFETY (NEEDLE) IMPLANT
NS IRRIG 1000ML POUR BTL (IV SOLUTION) ×2 IMPLANT
PACK BASIN DAY SURGERY FS (CUSTOM PROCEDURE TRAY) ×2 IMPLANT
PAD CAST 3X4 CTTN HI CHSV (CAST SUPPLIES) ×1 IMPLANT
PAD CAST 4YDX4 CTTN HI CHSV (CAST SUPPLIES) IMPLANT
PADDING CAST ABS 4INX4YD NS (CAST SUPPLIES) ×1
PADDING CAST ABS COTTON 4X4 ST (CAST SUPPLIES) ×1 IMPLANT
PADDING CAST COTTON 3X4 STRL (CAST SUPPLIES) ×2
PADDING CAST COTTON 4X4 STRL (CAST SUPPLIES)
PADDING UNDERCAST 2 STRL (CAST SUPPLIES) ×1
PADDING UNDERCAST 2X4 STRL (CAST SUPPLIES) ×1 IMPLANT
PLATE STRAIGHT LOCK 1.5 (Plate) ×2 IMPLANT
SCREW NL 1.5X12 (Screw) ×4 IMPLANT
SCREW NONIOC 1.5 14M (Screw) ×6 IMPLANT
SCREW NONIOC 1.5 16M (Screw) ×2 IMPLANT
SHEET MEDIUM DRAPE 40X70 STRL (DRAPES) ×2 IMPLANT
SLEEVE SCD COMPRESS KNEE MED (STOCKING) ×2 IMPLANT
SLING ARM FOAM STRAP LRG (SOFTGOODS) ×2 IMPLANT
SPLINT PLASTER CAST XFAST 4X15 (CAST SUPPLIES) ×15 IMPLANT
SPLINT PLASTER XTRA FAST SET 4 (CAST SUPPLIES) ×15
STOCKINETTE 4X48 STRL (DRAPES) ×2 IMPLANT
STRIP CLOSURE SKIN 1/2X4 (GAUZE/BANDAGES/DRESSINGS) ×2 IMPLANT
SUCTION FRAZIER HANDLE 10FR (MISCELLANEOUS) ×1
SUCTION TUBE FRAZIER 10FR DISP (MISCELLANEOUS) ×1 IMPLANT
SUT MERSILENE 4 0 P 3 (SUTURE) IMPLANT
SUT PROLENE 3 0 PS 2 (SUTURE) ×2 IMPLANT
SUT VIC AB 4-0 P-3 18XBRD (SUTURE) IMPLANT
SUT VIC AB 4-0 P3 18 (SUTURE)
SUT VICRYL 4-0 PS2 18IN ABS (SUTURE) ×2 IMPLANT
SUT VICRYL+ 3-0 27IN RB-1 (SUTURE) IMPLANT
SYR 10ML LL (SYRINGE) IMPLANT
SYR BULB EAR ULCER 3OZ GRN STR (SYRINGE) ×2 IMPLANT
TOWEL GREEN STERILE FF (TOWEL DISPOSABLE) ×4 IMPLANT
TUBE CONNECTING 20X1/4 (TUBING) ×2 IMPLANT
UNDERPAD 30X36 HEAVY ABSORB (UNDERPADS AND DIAPERS) IMPLANT

## 2021-02-04 NOTE — Progress Notes (Signed)
Assisted Dr. Brock with right, ultrasound guided, axillary block. Side rails up, monitors on throughout procedure. See vital signs in flow sheet. Tolerated Procedure well. 

## 2021-02-04 NOTE — Transfer of Care (Signed)
Immediate Anesthesia Transfer of Care Note  Patient: Phillip Wallace.  Procedure(s) Performed: OPEN REDUCTION INTERNAL FIXATION (ORIF) RIGHT SMALL FINGER METACARPAL FRACTURE (Right: Finger)  Patient Location: PACU  Anesthesia Type:MAC combined with regional for post-op pain  Level of Consciousness: awake, alert  and oriented  Airway & Oxygen Therapy: Patient Spontanous Breathing and Patient connected to face mask oxygen  Post-op Assessment: Report given to RN and Post -op Vital signs reviewed and stable  Post vital signs: Reviewed and stable  Last Vitals:  Vitals Value Taken Time  BP 117/79 02/04/21 1546  Temp 36.4 C 02/04/21 1546  Pulse 60 02/04/21 1549  Resp 15 02/04/21 1549  SpO2 100 % 02/04/21 1549  Vitals shown include unvalidated device data.  Last Pain:  Vitals:   02/04/21 1244  TempSrc: Oral  PainSc: 0-No pain      Patients Stated Pain Goal: 3 (48/25/00 3704)  Complications: No notable events documented.

## 2021-02-04 NOTE — H&P (Signed)
Phillip Wallace. is an 35 y.o. male.   Chief Complaint: Right hand pain, swelling, and deformity HPI: Patient is a very pleasant 35 year old male right-hand-dominant with right hand trauma approximately 6 weeks ago with impending malunion right small finger metacarpal distal third  History reviewed. No pertinent past medical history.  History reviewed. No pertinent surgical history.  History reviewed. No pertinent family history. Social History:  reports that he has been smoking cigarettes. He has been smoking an average of .5 packs per day. He has never used smokeless tobacco. He reports current alcohol use. He reports current drug use. Drug: Marijuana.  Allergies: No Known Allergies  No medications prior to admission.    No results found for this or any previous visit (from the past 48 hour(s)). No results found.  Review of Systems  All other systems reviewed and are negative.  Blood pressure 124/74, pulse 78, temperature (!) 97.5 F (36.4 C), temperature source Oral, resp. rate 14, height 5\' 7"  (1.702 m), weight 65.3 kg, SpO2 99 %. Physical Exam Constitutional:      Appearance: Normal appearance.  HENT:     Head: Normocephalic and atraumatic.  Eyes:     Pupils: Pupils are equal, round, and reactive to light.  Cardiovascular:     Rate and Rhythm: Normal rate.  Pulmonary:     Effort: Pulmonary effort is normal.  Musculoskeletal:     Right hand: Deformity and bony tenderness present. Decreased range of motion.     Comments: Pain, swelling, deformity to right small finger dorsally  Neurological:     General: No focal deficit present.     Mental Status: He is alert and oriented to person, place, and time.  Psychiatric:        Mood and Affect: Mood normal.        Behavior: Behavior normal.        Thought Content: Thought content normal.        Judgment: Judgment normal.     Assessment/Plan 35 year old male right-hand-dominant with impending right small finger  metacarpal malunion status post trauma 6 weeks ago.  Have discussed the role of operative fixation of this fracture as an outpatient today.  Patient understands the risks and benefits and the significant risk of postoperative stiffness in the future need for repeat surgery to include hardware removal and tenolysis as necessary.  The patient was to proceed to soon as possible.  31, MD 02/04/2021, 2:30 PM

## 2021-02-04 NOTE — Anesthesia Preprocedure Evaluation (Addendum)
Anesthesia Evaluation  Patient identified by MRN, date of birth, ID band Patient awake    Reviewed: Allergy & Precautions, NPO status , Patient's Chart, lab work & pertinent test results  History of Anesthesia Complications Negative for: history of anesthetic complications  Airway Mallampati: I  TM Distance: >3 FB Neck ROM: Full    Dental  (+) Dental Advisory Given, Chipped   Pulmonary Current Smoker and Patient abstained from smoking.,    Pulmonary exam normal        Cardiovascular negative cardio ROS Normal cardiovascular exam     Neuro/Psych negative neurological ROS  negative psych ROS   GI/Hepatic negative GI ROS, Neg liver ROS,   Endo/Other  negative endocrine ROS  Renal/GU negative Renal ROS     Musculoskeletal negative musculoskeletal ROS (+)   Abdominal   Peds  Hematology negative hematology ROS (+)   Anesthesia Other Findings   Reproductive/Obstetrics                            Anesthesia Physical Anesthesia Plan  ASA: 2  Anesthesia Plan: Regional   Post-op Pain Management:    Induction:   PONV Risk Score and Plan: 0 and Propofol infusion and Treatment may vary due to age or medical condition  Airway Management Planned: Natural Airway and Simple Face Mask  Additional Equipment: None  Intra-op Plan:   Post-operative Plan:   Informed Consent: I have reviewed the patients History and Physical, chart, labs and discussed the procedure including the risks, benefits and alternatives for the proposed anesthesia with the patient or authorized representative who has indicated his/her understanding and acceptance.       Plan Discussed with: CRNA and Anesthesiologist  Anesthesia Plan Comments:        Anesthesia Quick Evaluation

## 2021-02-04 NOTE — Op Note (Signed)
Patient was taken the operating suite and after induction of adequate regional anesthetic and IV sedation the right upper extremity was prepped and draped in the usual sterile fashion.  An Esmarch was used to exsanguinate the limb and the tourniquet was inflated to 250 mmHg.  This point time incision was made over the small metacarpal and the interval between the [facility] and EDQ was incised and these were retracted appropriately.  They were protected.  Subperiosteal dissection was undertaken to distal third metacarpal fracture with impending malunion.  Abundant callus was removed into the fracture site was identified once the fracture site was identified and debrided of callus reduction was performed with longitudinal traction and reduction clamp.  A 6-hole 1.5 mm Alps handset plate was placed dorsally with 3 cortical screws above and 3 cortical screws below the malunion site.  Fluoroscopic imaging in multiple views revealed adequate reduction and good placement of the hardware.  Once this is confirmed the wound was irrigated loosely closed in layers of 4-0 Vicryl to cover the hardware followed by 3-0 Prolene subcuticular stitch on the skin.  Steri-Strips, 4 x 4's, and a ulnar gutter splint was applied.  Patient tolerated this procedure well went to come in stable fashion.

## 2021-02-04 NOTE — Anesthesia Postprocedure Evaluation (Signed)
Anesthesia Post Note  Patient: Phillip Wallace.  Procedure(s) Performed: OPEN REDUCTION INTERNAL FIXATION (ORIF) RIGHT SMALL FINGER METACARPAL FRACTURE (Right: Finger)     Patient location during evaluation: PACU Anesthesia Type: Regional Level of consciousness: awake and alert Pain management: pain level controlled Vital Signs Assessment: post-procedure vital signs reviewed and stable Respiratory status: spontaneous breathing, nonlabored ventilation and respiratory function stable Cardiovascular status: stable and blood pressure returned to baseline Anesthetic complications: no   No notable events documented.  Last Vitals:  Vitals:   02/04/21 1546 02/04/21 1600  BP: 117/79 129/86  Pulse: (!) 53 (!) 59  Resp: 15 15  Temp: (!) 36.4 C   SpO2: 100% 98%    Last Pain:  Vitals:   02/04/21 1546  TempSrc:   PainSc: 0-No pain                 Beryle Lathe

## 2021-02-04 NOTE — Anesthesia Procedure Notes (Signed)
Anesthesia Regional Block: Axillary brachial plexus block   Pre-Anesthetic Checklist: , timeout performed,  Correct Patient, Correct Site, Correct Laterality,  Correct Procedure, Correct Position, site marked,  Risks and benefits discussed,  Surgical consent,  Pre-op evaluation,  At surgeon's request and post-op pain management  Laterality: Right  Prep: chloraprep       Needles:  Injection technique: Single-shot  Needle Type: Echogenic Needle     Needle Length: 5cm  Needle Gauge: 21     Additional Needles:   Narrative:  Start time: 02/04/2021 1:08 PM End time: 02/04/2021 1:12 PM Injection made incrementally with aspirations every 5 mL.  Performed by: Personally  Anesthesiologist: Beryle Lathe, MD  Additional Notes: No pain on injection. No increased resistance to injection. Injection made in 5cc increments. Good needle visualization. Patient tolerated the procedure well.

## 2021-02-04 NOTE — Discharge Instructions (Addendum)

## 2021-02-04 NOTE — Brief Op Note (Signed)
02/04/2021  3:44 PM  PATIENT:  Phillip Wallace.  35 y.o. male  PRE-OPERATIVE DIAGNOSIS:  RIGHT SMALL METACARPAL FRACTURE  POST-OPERATIVE DIAGNOSIS:  RIGHT SMALL METACARPAL FRACTURE  PROCEDURE:  Procedure(s) with comments: OPEN REDUCTION INTERNAL FIXATION (ORIF) RIGHT SMALL FINGER METACARPAL FRACTURE (Right) - MAC WITH BLOCK  SURGEON:  Surgeon(s) and Role:    Charlotte Crumb, MD - Primary  PHYSICIAN ASSISTANT:   ASSISTANTS: none   ANESTHESIA:   none  EBL:  5 mL   BLOOD ADMINISTERED:none  DRAINS: none   LOCAL MEDICATIONS USED:  NONE  SPECIMEN:  No Specimen  DISPOSITION OF SPECIMEN:  N/A  COUNTS:  YES  TOURNIQUET:   Total Tourniquet Time Documented: Upper Arm (Right) - 47 minutes Total: Upper Arm (Right) - 47 minutes   DICTATION: .Viviann Spare Dictation  PLAN OF CARE: Discharge to home after PACU  PATIENT DISPOSITION:  PACU - hemodynamically stable.   Delay start of Pharmacological VTE agent (>24hrs) due to surgical blood loss or risk of bleeding: yes

## 2021-02-05 ENCOUNTER — Encounter (HOSPITAL_BASED_OUTPATIENT_CLINIC_OR_DEPARTMENT_OTHER): Payer: Self-pay | Admitting: Orthopedic Surgery

## 2022-04-02 ENCOUNTER — Encounter (HOSPITAL_COMMUNITY): Payer: Self-pay | Admitting: *Deleted

## 2022-04-02 ENCOUNTER — Emergency Department (HOSPITAL_COMMUNITY)
Admission: EM | Admit: 2022-04-02 | Discharge: 2022-04-03 | Disposition: A | Discharge status: Home / Self Care | Payer: Commercial Managed Care - PPO | Attending: Emergency Medicine | Admitting: Emergency Medicine

## 2022-04-02 ENCOUNTER — Other Ambulatory Visit: Payer: Self-pay

## 2022-04-02 ENCOUNTER — Emergency Department (HOSPITAL_COMMUNITY): Payer: Commercial Managed Care - PPO

## 2022-04-02 DIAGNOSIS — E162 Hypoglycemia, unspecified: Secondary | ICD-10-CM | POA: Diagnosis not present

## 2022-04-02 DIAGNOSIS — Z23 Encounter for immunization: Secondary | ICD-10-CM | POA: Insufficient documentation

## 2022-04-02 DIAGNOSIS — W109XXA Fall (on) (from) unspecified stairs and steps, initial encounter: Secondary | ICD-10-CM | POA: Diagnosis not present

## 2022-04-02 DIAGNOSIS — S0993XA Unspecified injury of face, initial encounter: Secondary | ICD-10-CM | POA: Diagnosis present

## 2022-04-02 DIAGNOSIS — S0181XA Laceration without foreign body of other part of head, initial encounter: Secondary | ICD-10-CM | POA: Insufficient documentation

## 2022-04-02 DIAGNOSIS — W19XXXA Unspecified fall, initial encounter: Secondary | ICD-10-CM

## 2022-04-02 DIAGNOSIS — D72829 Elevated white blood cell count, unspecified: Secondary | ICD-10-CM | POA: Insufficient documentation

## 2022-04-02 LAB — CBC
HCT: 45.9 % (ref 39.0–52.0)
Hemoglobin: 15.6 g/dL (ref 13.0–17.0)
MCH: 30.2 pg (ref 26.0–34.0)
MCHC: 34 g/dL (ref 30.0–36.0)
MCV: 88.8 fL (ref 80.0–100.0)
Platelets: 348 10*3/uL (ref 150–400)
RBC: 5.17 MIL/uL (ref 4.22–5.81)
RDW: 14.2 % (ref 11.5–15.5)
WBC: 19.9 10*3/uL — ABNORMAL HIGH (ref 4.0–10.5)
nRBC: 0 % (ref 0.0–0.2)

## 2022-04-02 LAB — COMPREHENSIVE METABOLIC PANEL
ALT: 28 U/L (ref 0–44)
AST: 32 U/L (ref 15–41)
Albumin: 4.5 g/dL (ref 3.5–5.0)
Alkaline Phosphatase: 58 U/L (ref 38–126)
Anion gap: 19 — ABNORMAL HIGH (ref 5–15)
BUN: 13 mg/dL (ref 6–20)
CO2: 21 mmol/L — ABNORMAL LOW (ref 22–32)
Calcium: 9.1 mg/dL (ref 8.9–10.3)
Chloride: 103 mmol/L (ref 98–111)
Creatinine, Ser: 1.15 mg/dL (ref 0.61–1.24)
GFR, Estimated: >60 mL/min (ref >60)
Glucose, Bld: 66 mg/dL — ABNORMAL LOW (ref 70–99)
Potassium: 3.7 mmol/L (ref 3.5–5.1)
Sodium: 143 mmol/L (ref 135–145)
Total Bilirubin: 0.8 mg/dL (ref 0.3–1.2)
Total Protein: 7.4 g/dL (ref 6.5–8.1)

## 2022-04-02 LAB — ETHANOL: Alcohol, Ethyl (B): 295 mg/dL — ABNORMAL HIGH (ref <10)

## 2022-04-02 LAB — CBG MONITORING, ED: Glucose-Capillary: 81 mg/dL (ref 70–99)

## 2022-04-02 MED ORDER — LIDOCAINE-EPINEPHRINE (PF) 2 %-1:200000 IJ SOLN
20.0000 mL | Freq: Once | INTRAMUSCULAR | Status: AC
  Administered 2022-04-02: 20 mL

## 2022-04-02 MED ORDER — TETANUS-DIPHTH-ACELL PERTUSSIS 5-2.5-18.5 LF-MCG/0.5 IM SUSY
0.5000 mL | PREFILLED_SYRINGE | Freq: Once | INTRAMUSCULAR | Status: AC
  Administered 2022-04-02: 0.5 mL via INTRAMUSCULAR
  Filled 2022-04-02: qty 0.5

## 2022-04-02 MED ORDER — IOHEXOL 350 MG/ML SOLN
75.0000 mL | Freq: Once | INTRAVENOUS | Status: AC | PRN
  Administered 2022-04-02: 75 mL via INTRAVENOUS

## 2022-04-02 MED ORDER — LACTATED RINGERS IV BOLUS
1000.0000 mL | Freq: Once | INTRAVENOUS | Status: AC
  Administered 2022-04-02: 1000 mL via INTRAVENOUS

## 2022-04-02 NOTE — ED Notes (Signed)
Glucose noted to be low, pt give apple juice

## 2022-04-02 NOTE — ED Triage Notes (Addendum)
Pt arrived POV after falling down stairs in his apartment. Laceration noted to L eyebrow. Dried blood noted to abdomen and bilateral arms. No other visible injuries. MD at bedside. C-collar applied on arrival

## 2022-04-02 NOTE — ED Notes (Signed)
Md suturing at bedside

## 2022-04-02 NOTE — ED Provider Notes (Signed)
[facility] EMERGENCY DEPARTMENT Provider Note   CSN: [id number] Arrival date & time: 04/02/22  2024     History  Chief Complaint  Patient presents with   Hughes Better. is a 36 y.o. male. Presenting with laceration to the left eyebrow after falling on the stairs.  States he fell down multiple stairs at his apartment after drinking too much.  Denies any drug use today.  Reports using marijuana 2 days ago. He denies any medical problems or daily medications.  Fall Pertinent negatives include no chest pain, no abdominal pain and no shortness of breath.       Home Medications Prior to Admission medications   Not on File      Allergies    Patient has no known allergies.    Review of Systems   Review of Systems  Constitutional:  Negative for chills and fever.  HENT:  Negative for ear pain and sore throat.   Eyes:  Negative for pain and visual disturbance.  Respiratory:  Negative for cough and shortness of breath.   Cardiovascular:  Negative for chest pain and palpitations.  Gastrointestinal:  Negative for abdominal pain and vomiting.  Genitourinary:  Negative for dysuria and hematuria.  Musculoskeletal:  Negative for arthralgias and back pain.  Skin:  Positive for wound. Negative for color change and rash.  Neurological:  Negative for seizures and syncope.  All other systems reviewed and are negative.   Physical Exam Updated Vital Signs BP 134/73   Pulse (!) 104   Temp 97.8 F (36.6 C) (Oral)   Resp 19   SpO2 96%  Physical Exam Vitals and nursing note reviewed.  Constitutional:      General: He is not in acute distress.    Appearance: He is well-developed.  HENT:     Head: Normocephalic and atraumatic.     Nose:     Comments: Blood in bilateral nares, no active bleeding Blood in oropharynx, no active bleeding Multiple missing teeth, patient reports this is baseline Eyes:     Extraocular Movements: Extraocular movements  intact.     Conjunctiva/sclera: Conjunctivae normal.  Cardiovascular:     Rate and Rhythm: Normal rate and regular rhythm.     Heart sounds: No murmur heard. Pulmonary:     Effort: Pulmonary effort is normal. No respiratory distress.     Breath sounds: Normal breath sounds.  Abdominal:     Palpations: Abdomen is soft.     Tenderness: There is no abdominal tenderness.  Musculoskeletal:        General: No swelling.     Cervical back: Neck supple.  Skin:    General: Skin is warm and dry.     Capillary Refill: Capillary refill takes less than 2 seconds.     Comments: Approximately 1.5 cm laceration along the lateral left eyebrow with active bleeding  Neurological:     Mental Status: He is alert.  Psychiatric:        Mood and Affect: Mood normal.     ED Results / Procedures / Treatments   Labs (all labs ordered are listed, but only abnormal results are displayed) Labs Reviewed  COMPREHENSIVE METABOLIC PANEL - Abnormal; Notable for the following components:      Result Value   CO2 21 (*)    Glucose, Bld 66 (*)    Anion gap 19 (*)    All other components within normal limits  CBC - Abnormal; Notable for  the following components:   WBC 19.9 (*)    All other components within normal limits  ETHANOL - Abnormal; Notable for the following components:   Alcohol, Ethyl (B) 295 (*)    All other components within normal limits  RAPID URINE DRUG SCREEN, HOSP PERFORMED  CBG MONITORING, ED    EKG None  Radiology CT CERVICAL SPINE WO CONTRAST  Result Date: 04/02/2022 CLINICAL DATA:  Status post fall. EXAM: CT CERVICAL SPINE WITHOUT CONTRAST TECHNIQUE: Multidetector CT imaging of the cervical spine was performed without intravenous contrast. Multiplanar CT image reconstructions were also generated. RADIATION DOSE REDUCTION: This exam was performed according to the departmental dose-optimization program which includes automated exposure control, adjustment of the mA and/or kV according  to patient size and/or use of iterative reconstruction technique. COMPARISON:  None Available. FINDINGS: Alignment: There is straightening of the normal cervical spine lordosis. Skull base and vertebrae: No acute fracture. No primary bone lesion or focal pathologic process. Soft tissues and spinal canal: No prevertebral fluid or swelling. No visible canal hematoma. Disc levels: Mild endplate sclerosis and mild anterior osteophyte formation are seen at the level of C5-C6. Mild anterior osteophyte formation is also seen at C4-C5 and C6-C7. Mild intervertebral disc space narrowing is seen at C5-C6. Normal, bilateral multilevel facet joints are noted. Upper chest: Negative. Other: None. IMPRESSION: 1. No acute fracture or subluxation in the cervical spine. 2. Mild degenerative changes at the level of C5-C6. Electronically Signed   By: Virgina Norfolk M.D.   On: 04/02/2022 23:05   CT MAXILLOFACIAL WO CONTRAST  Result Date: 04/02/2022 CLINICAL DATA:  Status post fall. EXAM: CT MAXILLOFACIAL WITHOUT CONTRAST TECHNIQUE: Multidetector CT imaging of the maxillofacial structures was performed. Multiplanar CT image reconstructions were also generated. RADIATION DOSE REDUCTION: This exam was performed according to the departmental dose-optimization program which includes automated exposure control, adjustment of the mA and/or kV according to patient size and/or use of iterative reconstruction technique. COMPARISON:  None Available. FINDINGS: Osseous: No fracture or mandibular dislocation. No destructive process. Orbits: A chronic deformity is seen involving the medial wall of the left orbit. Sinuses: Clear. Soft tissues: Moderate severity left preseptal and left supra orbital soft tissue swelling is seen. Limited intracranial: No significant or unexpected finding. IMPRESSION: 1. Moderate severity left preseptal and left supra orbital soft tissue swelling. 2. No acute fracture or mandibular dislocation. Electronically  Signed   By: Virgina Norfolk M.D.   On: 04/02/2022 23:02   CT HEAD WO CONTRAST  Result Date: 04/02/2022 CLINICAL DATA:  Status post trauma. EXAM: CT HEAD WITHOUT CONTRAST TECHNIQUE: Contiguous axial images were obtained from the base of the skull through the vertex without intravenous contrast. RADIATION DOSE REDUCTION: This exam was performed according to the departmental dose-optimization program which includes automated exposure control, adjustment of the mA and/or kV according to patient size and/or use of iterative reconstruction technique. COMPARISON:  None Available. FINDINGS: Brain: No evidence of acute infarction, hemorrhage, hydrocephalus, extra-axial collection or mass lesion/mass effect. Vascular: No hyperdense vessel or unexpected calcification. Skull: Normal. Negative for fracture or focal lesion. Sinuses/Orbits: A chronic appearing deformity is seen involving the medial wall of the left orbit. Other: There is moderate severity left-sided preseptal and supra orbital soft tissue swelling. IMPRESSION: 1. No acute intracranial abnormality. 2. Moderate severity left preseptal and left supra orbital soft tissue swelling. Electronically Signed   By: Virgina Norfolk M.D.   On: 04/02/2022 23:00   CT CHEST ABDOMEN PELVIS W CONTRAST  Result Date:  04/02/2022 CLINICAL DATA:  Blunt poly trauma. Fell down stairs in his apartment. Laceration to the left eyebrow. Dried blood noted to the abdomen and bilateral arms. EXAM: CT CHEST, ABDOMEN, AND PELVIS WITH CONTRAST TECHNIQUE: Multidetector CT imaging of the chest, abdomen and pelvis was performed following the standard protocol during bolus administration of intravenous contrast. RADIATION DOSE REDUCTION: This exam was performed according to the departmental dose-optimization program which includes automated exposure control, adjustment of the mA and/or kV according to patient size and/or use of iterative reconstruction technique. CONTRAST:  19mL OMNIPAQUE  IOHEXOL 350 MG/ML SOLN COMPARISON:  None Available. FINDINGS: CT CHEST FINDINGS Cardiovascular: No significant vascular findings. Normal heart size. No pericardial effusion. Mediastinum/Nodes: No enlarged mediastinal, hilar, or axillary lymph nodes. Thyroid gland, trachea, and esophagus demonstrate no significant findings. Lungs/Pleura: Lungs are clear. No pleural effusion or pneumothorax. Musculoskeletal: No chest wall mass or suspicious bone lesions identified. CT ABDOMEN PELVIS FINDINGS Hepatobiliary: No focal liver abnormality is seen. No gallstones, gallbladder wall thickening, or biliary dilatation. Pancreas: Unremarkable. No pancreatic ductal dilatation or surrounding inflammatory changes. Spleen: No splenic injury or perisplenic hematoma. Adrenals/Urinary Tract: No adrenal hemorrhage or renal injury identified. Bladder is unremarkable. Stomach/Bowel: Stomach is within normal limits. Appendix appears normal. No evidence of bowel wall thickening, distention, or inflammatory changes. Vascular/Lymphatic: No significant vascular findings are present. No enlarged abdominal or pelvic lymph nodes. Reproductive: Prostate is unremarkable. Other: No abdominal wall hernia or abnormality. No abdominopelvic ascites. Musculoskeletal: No fracture is seen. Bone island in the left sacral ala. IMPRESSION: 1. No CT evidence of acute intrathoracic, abdominal/pelvic visceral or vascular injury. 2. No evidence of fracture.  No significant soft tissue injury. Electronically Signed   By: Keane Police D.O.   On: 04/02/2022 22:55    Procedures .Marland KitchenLaceration Repair  Date/Time: 04/02/2022 10:48 PM  Performed by: Rosine Abe, MD Authorized by: Fredia Sorrow, MD   Consent:    Consent obtained:  Verbal   Consent given by:  Patient   Risks discussed:  Infection, pain, poor cosmetic result, need for additional repair, nerve damage, poor wound healing, retained foreign body, tendon damage and vascular damage   Alternatives  discussed:  No treatment and delayed treatment Universal protocol:    Procedure explained and questions answered to patient or proxy's satisfaction: yes     Patient identity confirmed:  Verbally with patient Anesthesia:    Anesthesia method:  Local infiltration   Local anesthetic:  Lidocaine 1% WITH epi Laceration details:    Location:  Scalp   Scalp location:  Frontal (left eyebrow)   Length (cm):  1.5 Pre-procedure details:    Preparation:  Patient was prepped and draped in usual sterile fashion Exploration:    Hemostasis achieved with:  Direct pressure   Imaging outcome: foreign body not noted     Wound exploration: entire depth of wound visualized   Treatment:    Area cleansed with:  Saline   Amount of cleaning:  Extensive   Irrigation solution:  Sterile saline   Irrigation method:  Syringe Skin repair:    Repair method:  Sutures   Suture size:  5-0   Suture material:  Fast-absorbing gut   Suture technique:  Simple interrupted   Number of sutures:  4 Repair type:    Repair type:  Simple Post-procedure details:    Dressing:  Antibiotic ointment and sterile dressing   Procedure completion:  Tolerated     Medications Ordered in ED Medications  lidocaine-EPINEPHrine (XYLOCAINE W/EPI) 2 %-1:200000 (  PF) injection 20 mL (20 mLs Other Given by Other 04/02/22 2039)  lactated ringers bolus 1,000 mL (0 mLs Intravenous Stopped 04/02/22 2220)  iohexol (OMNIPAQUE) 350 MG/ML injection 75 mL (75 mLs Intravenous Contrast Given 04/02/22 2236)  Tdap (BOOSTRIX) injection 0.5 mL (0.5 mLs Intramuscular Given 04/02/22 2327)    ED Course/ Medical Decision Making/ A&P                           Medical Decision Making Amount and/or Complexity of Data Reviewed Labs: ordered. Radiology: ordered.  Risk Prescription drug management.   36 year old male who denies medical problems presenting with laceration to left eyebrow after falling down the stairs. Reports alcohol use.  Appears  clinically intoxicated.  Denies drug use. On arrival vital signs are stable. Laceration to the left eyebrow with active bleeding, quick clot placed, and direct pressure held.  Differential diagnosis includes laceration, anemia, fracture, contusion, intracranial hemorrhage.  Labs reviewed.  Leukocytosis to 19.9, likely due to recent trauma.  Hypoglycemia to 66, with anion gap of 19. However, alcohol 295. Likely in the setting of excessive alcohol use.  CT showed soft tissue swelling in the left preseptal/periorbital region.  Patient continues to have intact extraocular movements.  Denies vision changes.  No other injuries identified.  IV fluids and p.o. given.  Patient tolerated.  Glucose recheck showed movement to 81.  Continue to tolerate additional p.o.  Laceration repaired.  See procedure note.  Patient tolerated well.  Tetanus booster given. Patient has a safe discharge plan with his fiance.  Recommended hydration, Tylenol, Motrin as needed for discomfort.  Strict return precautions given.        Final Clinical Impression(s) / ED Diagnoses Final diagnoses:  Fall, initial encounter  Laceration of forehead, initial encounter    Rx / DC Orders ED Discharge Orders     None         Rosine Abe, MD 04/02/22 [id number]    Fredia Sorrow, MD 04/09/22 [phone]

## 2022-04-02 NOTE — Discharge Instructions (Signed)
Stay well-hydrated.  Use Tylenol/Motrin as needed for pain or discomfort.  Return to the ED for any signs of wound infection, including drainage or fever.  Follow-up with your primary doctor.

## 2022-04-02 NOTE — ED Notes (Signed)
Per lab Creatine is 1.15

## 2022-09-24 IMAGING — DX DG HAND COMPLETE 3+V*R*
3 series · 3 of 3 positions shown · non-contrast
Comparison: None.

CLINICAL DATA: Injured right hand 1 week ago.

EXAM:
RIGHT HAND - COMPLETE 3+ VIEW

[hand pa]
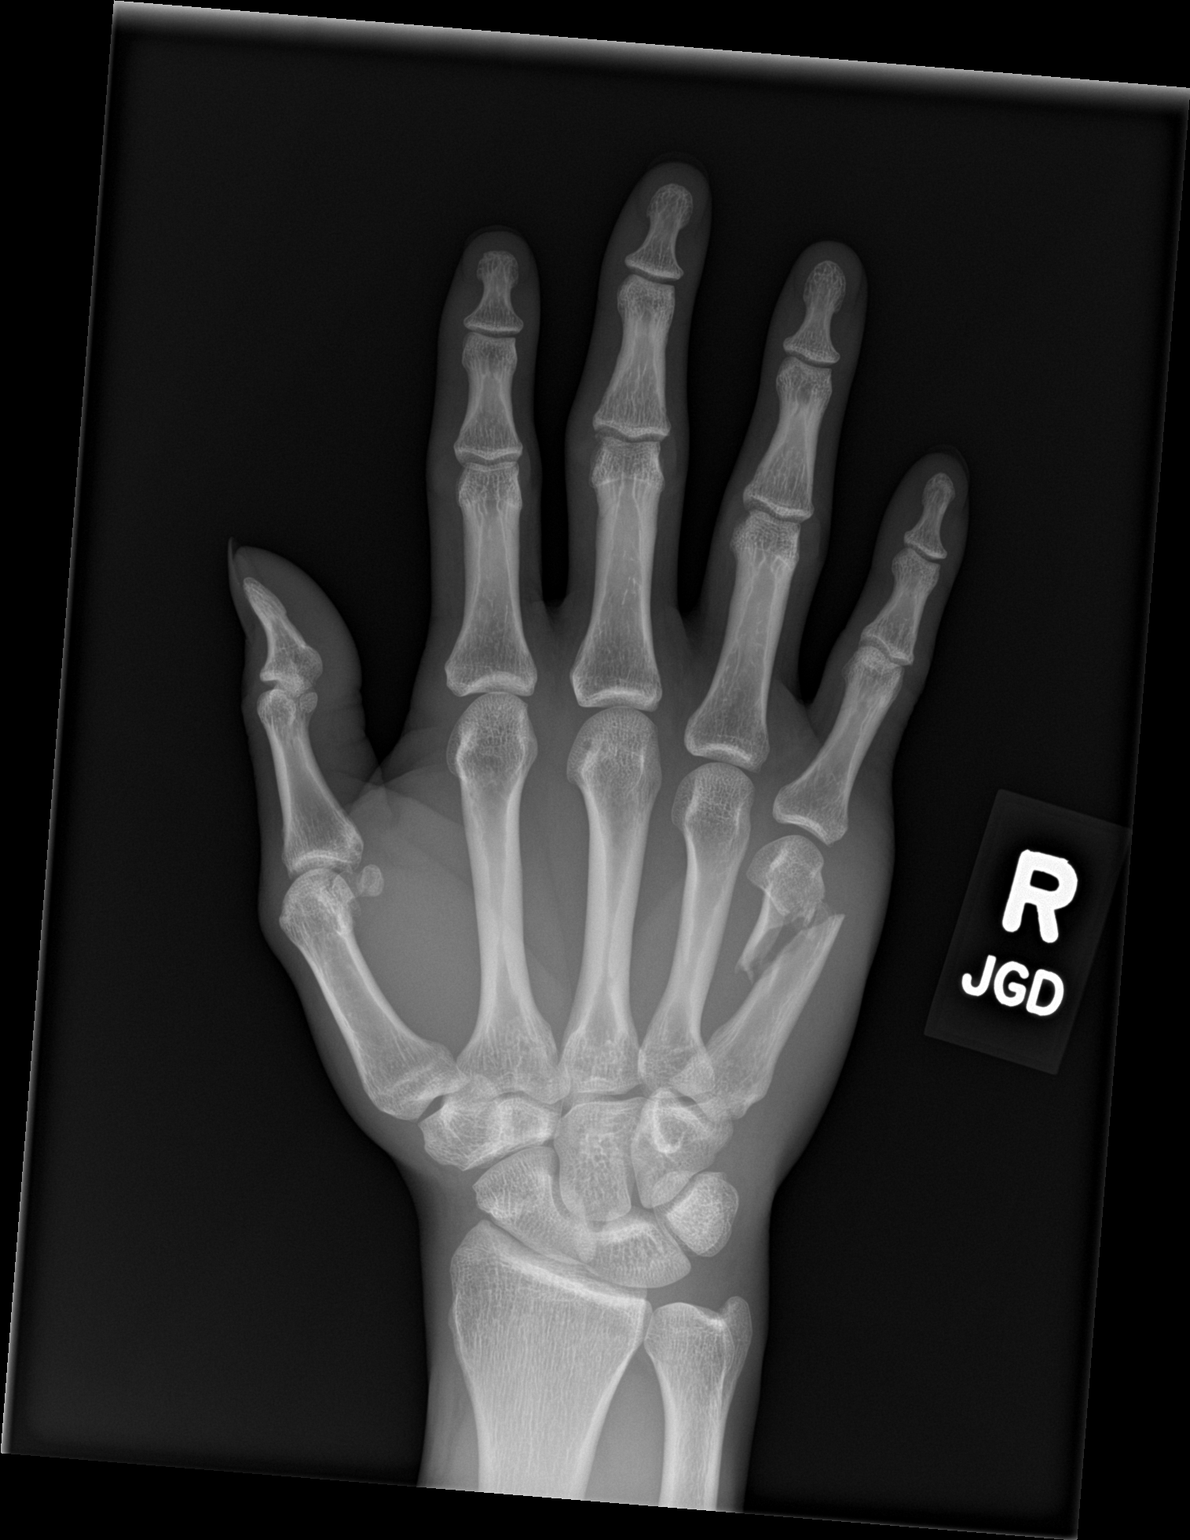

[hand obl]
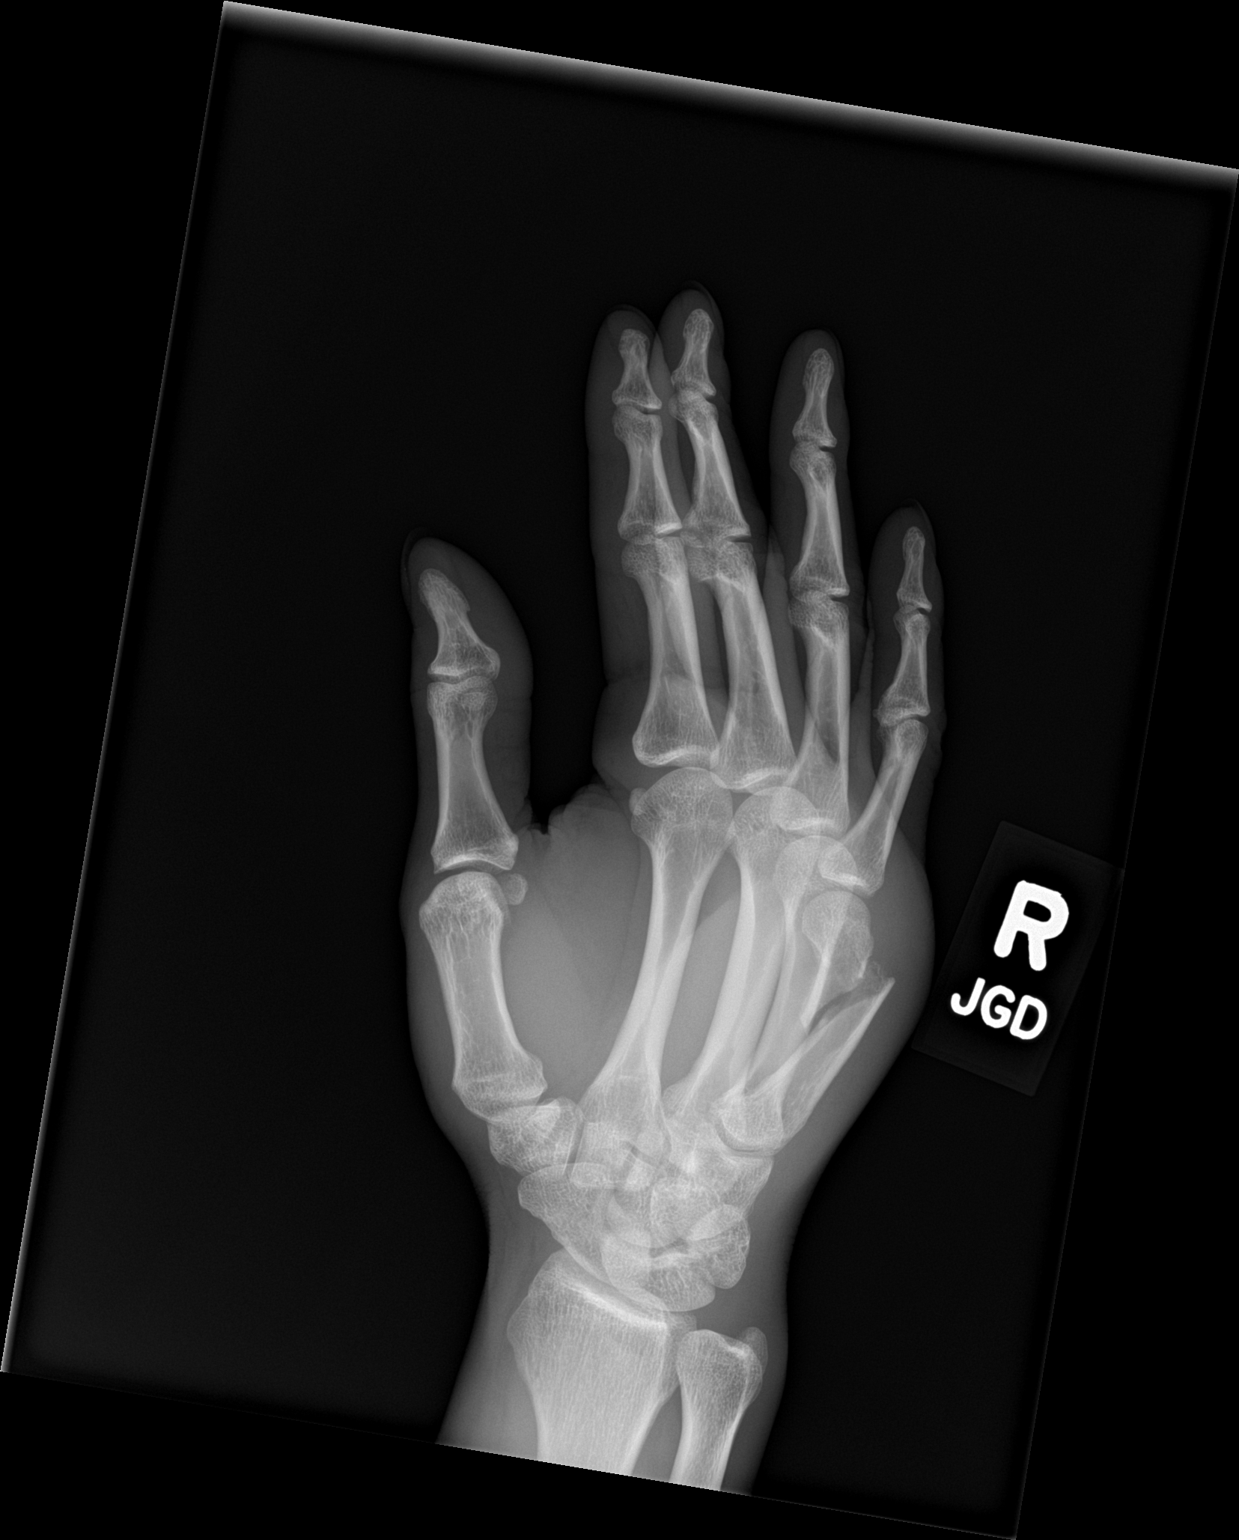

[hand lat]
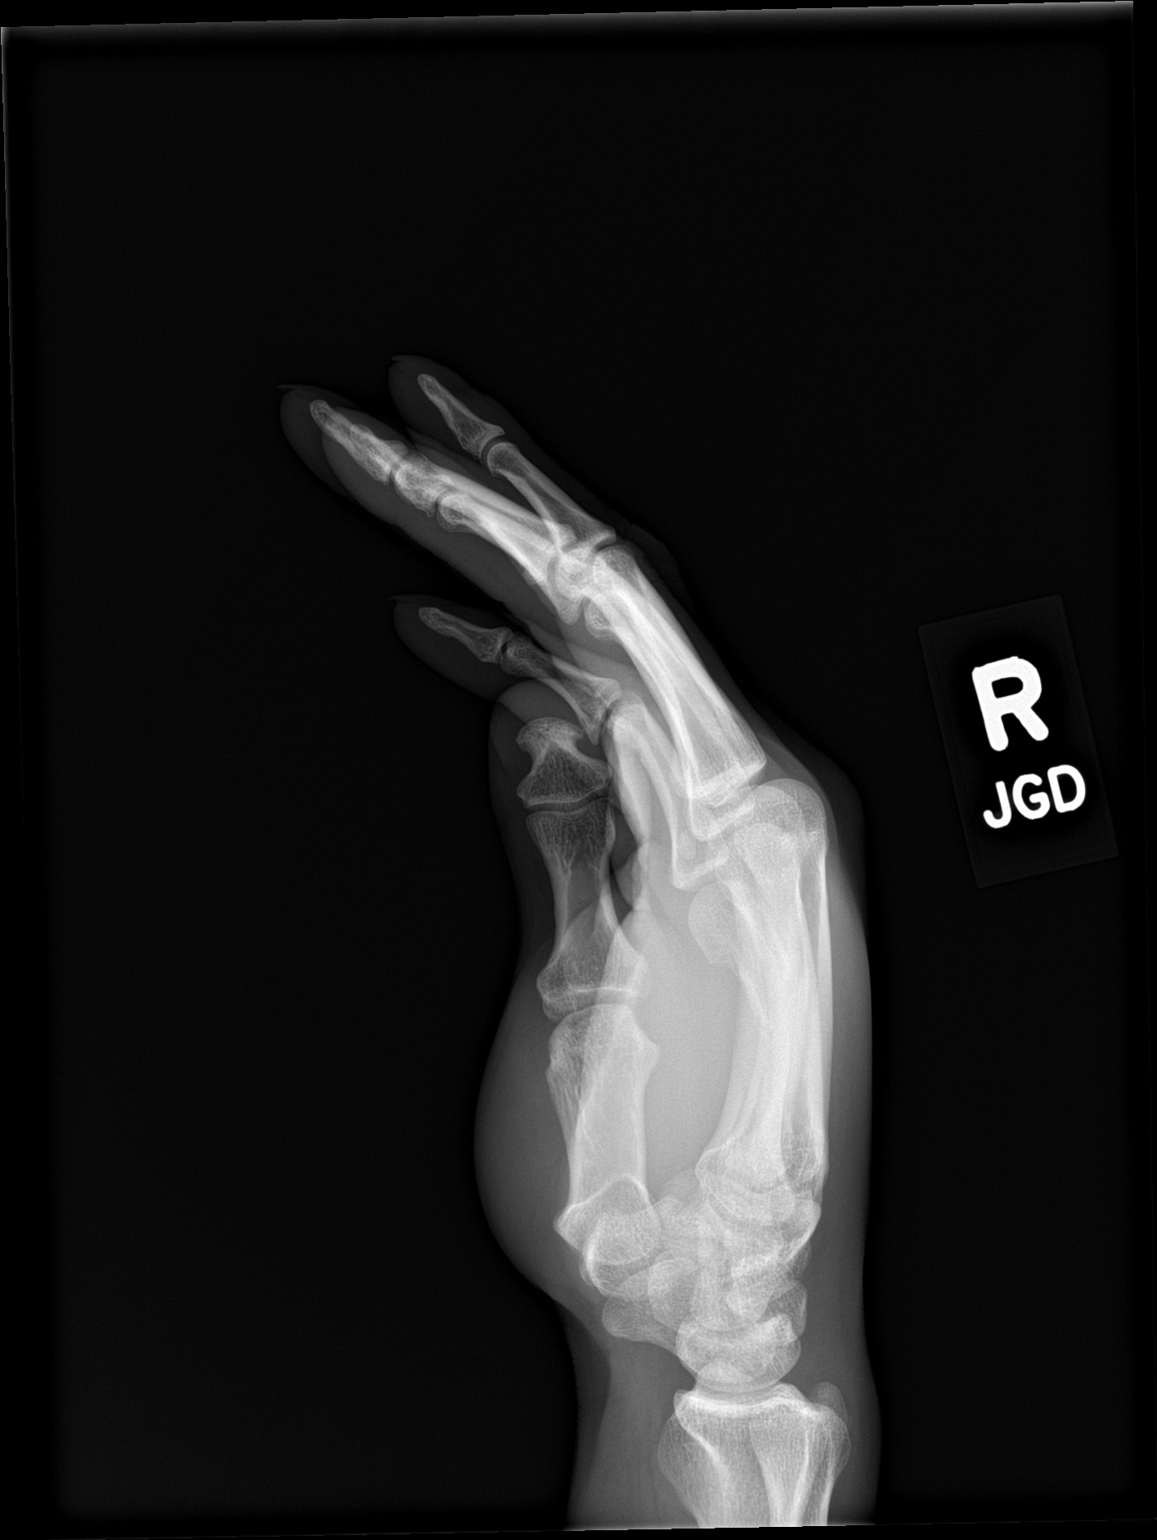

[3 of 3 positions shown; findings below may reference images not displayed]

FINDINGS: There is a long oblique fracture involving the distal shaft and
lower neck region of the fifth metacarpal with significant apex
dorsal angulation.

The joint spaces are maintained.  No other fractures are identified.
IMPRESSION: Angulated oblique fracture of the fifth metacarpal

## 2023-04-13 ENCOUNTER — Encounter (HOSPITAL_COMMUNITY): Payer: Self-pay | Admitting: Emergency Medicine

## 2023-04-13 ENCOUNTER — Ambulatory Visit (HOSPITAL_COMMUNITY)
Admission: EM | Admit: 2023-04-13 | Discharge: 2023-04-13 | Disposition: A | Discharge status: Home / Self Care | Payer: Commercial Managed Care - PPO

## 2023-04-13 DIAGNOSIS — A084 Viral intestinal infection, unspecified: Secondary | ICD-10-CM

## 2023-04-13 DIAGNOSIS — Z0289 Encounter for other administrative examinations: Secondary | ICD-10-CM

## 2023-04-13 NOTE — Discharge Instructions (Addendum)
Your evaluation suggests that your symptoms are most likely due to viral stomach illness (gastroenteritis/"stomach bug") which will improve on its own with rest and fluids in the next few days.   You may use over the counter medicines for aches and pains such as tylenol as needed.  Start sipping on liquids (broth, water, gatorade, etc). If you are able to keep liquids down without vomiting for 1-2 hours, you may eat bland foods like jello, pudding, applesauce, bananas, rice, and white toast. Once you can tolerate blands, you may return to normal diet.   Pedialyte or gatorolyte may help to prevent/fix dehydration due to vomiting and diarrhea.  Please follow up with your primary care provider for further management. Return if you experience worsening or uncontrolled pain, inability to tolerate fluids by mouth, difficulty breathing, fevers 100.45F or greater, recurrent vomiting, or any other concerning symptoms.

## 2023-04-13 NOTE — ED Provider Notes (Signed)
[facility]    CSN: [id number] Arrival date & time: 04/13/23  1055      History   Chief Complaint Chief Complaint  Patient presents with   Diarrhea    HPI Arnav Cregg. is a 37 y.o. male.   Kavan Devan. is a 37 y.o. male presenting for chief complaint of diarrhea, vomiting, generalized abdominal cramping with diarrhea, and generalized fatigue that started 2 days ago. Reports generalized headache that's currently very mild and improved from yesterday. He has had multiple episodes of non-bloody diarrhea over the last 24 hours.  Denies recent known sick contacts with similar symptoms.  No viral URI symptoms, fever/chills, dizziness, nausea, rash, constipation, recent abdominal surgeries, recent antibiotic/steroid use, or recent water from an unknown source.  No recent changes in diet.  Has not attempted use of any over-the-counter medications to help with symptoms PTA.  States symptoms have improved significantly in the last 24 hours and he feels ready to go back to work tomorrow.  Requesting work note to return tomorrow.   Diarrhea   History reviewed. No pertinent past medical history.  There are no problems to display for this patient.   Past Surgical History:  Procedure Laterality Date   OPEN REDUCTION INTERNAL FIXATION (ORIF) METACARPAL Right 02/04/2021   Procedure: OPEN REDUCTION INTERNAL FIXATION (ORIF) RIGHT SMALL FINGER METACARPAL FRACTURE;  Surgeon: Dairl Ponder, MD;  Location: [facility];  Service: Orthopedics;  Laterality: Right;  MAC WITH BLOCK       Home Medications    Prior to Admission medications   Not on File    Family History History reviewed. No pertinent family history.  Social History Social History   Tobacco Use   Smoking status: Every Day    Current packs/day: 0.50    Types: Cigarettes   Smokeless tobacco: Never  Substance Use Topics   Alcohol use: Yes   Drug use: Yes    Types: Marijuana     Comment: last pm     Allergies   Patient has no known allergies.   Review of Systems Review of Systems  Gastrointestinal:  Positive for diarrhea.  Per HPI   Physical Exam Triage Vital Signs ED Triage Vitals [04/13/23 1113]  Encounter Vitals Group     BP (!) 137/93     Systolic BP Percentile      Diastolic BP Percentile      Pulse Rate 67     Resp 16     Temp 97.8 F (36.6 C)     Temp Source Oral     SpO2 97 %     Weight      Height      Head Circumference      Peak Flow      Pain Score 0     Pain Loc      Pain Education      Exclude from Growth Chart    No data found.  Updated Vital Signs BP (!) 137/93 (BP Location: Left Arm)   Pulse 67   Temp 97.8 F (36.6 C) (Oral)   Resp 16   SpO2 97%   Visual Acuity Right Eye Distance:   Left Eye Distance:   Bilateral Distance:    Right Eye Near:   Left Eye Near:    Bilateral Near:     Physical Exam Vitals and nursing note reviewed.  Constitutional:      Appearance: He is not ill-appearing or toxic-appearing.  HENT:  Head: Normocephalic and atraumatic.     Right Ear: Hearing and external ear normal.     Left Ear: Hearing and external ear normal.     Nose: Nose normal.     Mouth/Throat:     Lips: Pink.  Eyes:     General: Lids are normal. Vision grossly intact. Gaze aligned appropriately.     Extraocular Movements: Extraocular movements intact.     Conjunctiva/sclera: Conjunctivae normal.  Pulmonary:     Effort: Pulmonary effort is normal.  Abdominal:     General: Bowel sounds are normal.     Palpations: Abdomen is soft.     Tenderness: There is no abdominal tenderness. There is no right CVA tenderness, left CVA tenderness or guarding.  Musculoskeletal:     Cervical back: Neck supple.  Skin:    General: Skin is warm and dry.     Capillary Refill: Capillary refill takes less than 2 seconds.     Findings: No rash.  Neurological:     General: No focal deficit present.     Mental Status: He is  alert and oriented to person, place, and time. Mental status is at baseline.     Cranial Nerves: No dysarthria or facial asymmetry.  Psychiatric:        Mood and Affect: Mood normal.        Speech: Speech normal.        Behavior: Behavior normal.        Thought Content: Thought content normal.        Judgment: Judgment normal.      UC Treatments / Results  Labs (all labs ordered are listed, but only abnormal results are displayed) Labs Reviewed - No data to display  EKG   Radiology No results found.  Procedures Procedures (including critical care time)  Medications Ordered in UC Medications - No data to display  Initial Impression / Assessment and Plan / UC Course  I have reviewed the triage vital signs and the nursing notes.  Pertinent labs & imaging results that were available during my care of the patient were reviewed by me and considered in my medical decision making (see chart for details).   1.  Encounter to obtain excuse from work, viral gastroenteritis Presentation suspicious for viral gastroenteritis etiology that has now improved.  Patient may continue use of over-the-counter medications as needed to treat symptoms.  Brat diet encouraged.  Increase fluid intake to prevent dehydration.  Work note given to return tomorrow.  Counseled patient on potential for adverse effects with medications prescribed/recommended today, strict ER and return-to-clinic precautions discussed, patient verbalized understanding.    Final Clinical Impressions(s) / UC Diagnoses   Final diagnoses:  Encounter to obtain excuse from work  Viral gastroenteritis     Discharge Instructions      Your evaluation suggests that your symptoms are most likely due to viral stomach illness (gastroenteritis/"stomach bug") which will improve on its own with rest and fluids in the next few days.   You may use over the counter medicines for aches and pains such as tylenol as needed.  Start  sipping on liquids (broth, water, gatorade, etc). If you are able to keep liquids down without vomiting for 1-2 hours, you may eat bland foods like jello, pudding, applesauce, bananas, rice, and white toast. Once you can tolerate blands, you may return to normal diet.   Pedialyte or gatorolyte may help to prevent/fix dehydration due to vomiting and diarrhea.  Please follow up with  your primary care provider for further management. Return if you experience worsening or uncontrolled pain, inability to tolerate fluids by mouth, difficulty breathing, fevers 100.46F or greater, recurrent vomiting, or any other concerning symptoms.     ED Prescriptions   None    PDMP not reviewed this encounter.   Carlisle Beers, [place] 04/13/23 1147

## 2023-04-13 NOTE — ED Triage Notes (Signed)
Headache, cramping abdominal pain, fatigue, and diarrhea x 2 days. Reports he vomited once. Denies cough, sore throat, generalized bodyaches, SOB, CP, palpitations, wheezing, nausea. Taking tylenol to try and help. Reports feeling better today than yesterday.

## 2023-07-17 ENCOUNTER — Ambulatory Visit (HOSPITAL_COMMUNITY)
Admission: EM | Admit: 2023-07-17 | Discharge: 2023-07-17 | Disposition: A | Discharge status: Home / Self Care | Payer: Self-pay | Attending: Physician Assistant | Admitting: Physician Assistant

## 2023-07-17 ENCOUNTER — Encounter (HOSPITAL_COMMUNITY): Payer: Self-pay

## 2023-07-17 DIAGNOSIS — R55 Syncope and collapse: Secondary | ICD-10-CM

## 2023-07-17 DIAGNOSIS — W19XXXA Unspecified fall, initial encounter: Secondary | ICD-10-CM

## 2023-07-17 DIAGNOSIS — S0181XA Laceration without foreign body of other part of head, initial encounter: Secondary | ICD-10-CM

## 2023-07-17 MED ORDER — LIDOCAINE-EPINEPHRINE 1 %-1:100000 IJ SOLN
INTRAMUSCULAR | Status: AC
  Filled 2023-07-17: qty 1

## 2023-07-17 NOTE — ED Provider Notes (Signed)
 [facility]    CSN: [id number] Arrival date & time: 07/17/23  1723      History   Chief Complaint Chief Complaint  Patient presents with   Fall    HPI Phillip Wallace. is a 38 y.o. male.   Patient here for evaluation of laceration to chin x 30 minutes ago.  He states tetanus is UTD.  He states he was at home when he 'passed out'.  Woke up on floor.  Believes it's due to donating plasma this morning and not eating anything.  He denies HA, biting tongue, vision changes, n/v, chest pain, SOB, n/t, weakness.  He declines ECG today and states he feels fine.    History reviewed. No pertinent past medical history.  There are no active problems to display for this patient.   Past Surgical History:  Procedure Laterality Date   OPEN REDUCTION INTERNAL FIXATION (ORIF) METACARPAL Right 02/04/2021   Procedure: OPEN REDUCTION INTERNAL FIXATION (ORIF) RIGHT SMALL FINGER METACARPAL FRACTURE;  Surgeon: Sissy Cough, MD;  [facility]:  [facility];  Service: Orthopedics;  Laterality: Right;  MAC WITH BLOCK       Home Medications    Prior to Admission medications   Not on File    Family History History reviewed. No pertinent family history.  Social History Social History   Tobacco Use   Smoking status: Every Day    Current packs/day: 0.50    Types: Cigarettes   Smokeless tobacco: Never  Substance Use Topics   Alcohol use: Yes   Drug use: Yes    Types: Marijuana    Comment: last pm     Allergies   Patient has no known allergies.   Review of Systems Review of Systems  Constitutional:  Negative for chills, fatigue and fever.  HENT:  Negative for dental problem.   Eyes:  Negative for photophobia and visual disturbance.  Respiratory:  Negative for cough, shortness of breath and wheezing.   Cardiovascular:  Negative for chest pain, palpitations and leg swelling.  Gastrointestinal:  Negative for nausea and vomiting.  Musculoskeletal:   Negative for arthralgias and myalgias.  Skin:  Positive for wound. Negative for color change.  Neurological:  Positive for syncope. Negative for dizziness, seizures, facial asymmetry, speech difficulty, weakness, light-headedness, numbness and headaches.  Psychiatric/Behavioral:  Negative for agitation, behavioral problems, confusion, decreased concentration and sleep disturbance.      Physical Exam Triage Vital Signs ED Triage Vitals  Encounter Vitals Group     BP 07/17/23 1739 129/81     Systolic BP Percentile --      Diastolic BP Percentile --      Pulse Rate 07/17/23 1739 90     Resp 07/17/23 1739 16     Temp 07/17/23 1739 98.4 F (36.9 C)     Temp Source 07/17/23 1739 Oral     SpO2 07/17/23 1739 95 %     Weight 07/17/23 1739 151 lb (68.5 kg)     Height 07/17/23 1739 5' 8 (1.727 m)     Head Circumference --      Peak Flow --      Pain Score 07/17/23 1740 5     Pain Loc --      Pain Education --      Exclude from Growth Chart --    No data found.  Updated Vital Signs BP 129/81 (BP Location: Right Arm)   Pulse 90   Temp 98.4 F (36.9 C) (Oral)  Resp 16   Ht 5' 8 (1.727 m)   Wt 151 lb (68.5 kg)   SpO2 95%   BMI 22.96 kg/m   Visual Acuity Right Eye Distance:   Left Eye Distance:   Bilateral Distance:    Right Eye Near:   Left Eye Near:    Bilateral Near:     Physical Exam Vitals and nursing note reviewed.  Constitutional:      General: He is not in acute distress.    Appearance: He is well-developed.  HENT:     Head: Normocephalic and atraumatic.     Comments: 3 cm laceration along chin    Mouth/Throat:     Dentition: Normal dentition. No dental tenderness or gum lesions.     Comments: No bleeding of gums No fractured teeth No tenderness to percussion  Eyes:     Extraocular Movements:     Right eye: Normal extraocular motion and no nystagmus.     Left eye: Normal extraocular motion and no nystagmus.     Conjunctiva/sclera: Conjunctivae normal.      Pupils: Pupils are equal, round, and reactive to light.  Cardiovascular:     Rate and Rhythm: Normal rate and regular rhythm.     Heart sounds: No murmur heard. Pulmonary:     Effort: Pulmonary effort is normal. No respiratory distress.     Breath sounds: Normal breath sounds.  Abdominal:     Palpations: Abdomen is soft.     Tenderness: There is no abdominal tenderness.  Musculoskeletal:        General: No swelling.     Cervical back: Neck supple. No rigidity. No pain with movement, spinous process tenderness or muscular tenderness. Normal range of motion.  Skin:    General: Skin is warm and dry.     Capillary Refill: Capillary refill takes less than 2 seconds.  Neurological:     General: No focal deficit present.     Mental Status: He is alert and oriented to person, place, and time.     GCS: GCS eye subscore is 4. GCS verbal subscore is 5. GCS motor subscore is 6.     Cranial Nerves: Cranial nerves 2-12 are intact.     Motor: Motor function is intact. No weakness, tremor or abnormal muscle tone.     Gait: Gait normal.     Deep Tendon Reflexes:     Reflex Scores:      Patellar reflexes are 2+ on the right side and 2+ on the left side. Psychiatric:        Mood and Affect: Mood normal.      UC Treatments / Results  Labs (all labs ordered are listed, but only abnormal results are displayed) Labs Reviewed - No data to display  EKG   Radiology No results found.  Procedures Laceration Repair  Date/Time: 07/17/2023 6:19 PM  Performed by: Juleen Rush, PA-C Authorized by: Juleen Rush, PA-C   Consent:    Consent obtained:  Verbal   Consent given by:  Patient   Risks, benefits, and alternatives were discussed: yes     Risks discussed:  Infection, pain, need for additional repair and poor cosmetic result Universal protocol:    Procedure explained and questions answered to patient or proxy's satisfaction: yes     Patient identity confirmed:  Verbally with  patient Anesthesia:    Anesthesia method:  Local infiltration   Local anesthetic:  Lidocaine  1% WITH epi Laceration details:    Location:  Face  Face location:  Chin   Length (cm):  2.5 Pre-procedure details:    Preparation:  Patient was prepped and draped in usual sterile fashion Exploration:    Hemostasis achieved with:  Direct pressure and epinephrine    Wound exploration: wound explored through full range of motion     Wound extent: no foreign bodies/material noted, no underlying fracture noted and no vascular damage noted     Contaminated: no   Treatment:    Area cleansed with:  Chlorhexidine and saline   Amount of cleaning:  Standard   Irrigation solution:  Tap water   Irrigation method:  Syringe   Debridement:  None   Undermining:  None Skin repair:    Repair method:  Sutures   Suture size:  5-0   Suture material:  Nylon   Suture technique:  Simple interrupted   Number of sutures:  4 Approximation:    Approximation:  Close Repair type:    Repair type:  Simple Post-procedure details:    Dressing:  Antibiotic ointment   Procedure completion:  Tolerated well, no immediate complications  (including critical care time)  Medications Ordered in UC Medications - No data to display  Initial Impression / Assessment and Plan / UC Course  I have reviewed the triage vital signs and the nursing notes.  Pertinent labs & imaging results that were available during my care of the patient were reviewed by me and considered in my medical decision making (see chart for details).     Declined ECG Strict ED precautions provided Follow up 1 week for suture removal, sooner with signs of infection as discussed Final Clinical Impressions(s) / UC Diagnoses   Final diagnoses:  Fall, initial encounter  Chin laceration, initial encounter  Syncope and collapse     Discharge Instructions      Return 1 week for suture removal Keep sutures clean, dry, covered, do not get wet for 48  hours Return sooner if you have signs of infection, such as spreading redness, pain, tenderness, swelling, or purulent discharge    ED Prescriptions   None    PDMP not reviewed this encounter.   Juleen Rush, PA-C 07/17/23 1820

## 2023-07-17 NOTE — ED Triage Notes (Signed)
 Patient here today with c/o a wound on his chin after fainting and falling on the the hardwood floor. Patient states that he went and donated plasma this morning and doesn't think he ate well enough. Patient does not think he hit his head but his left elbow is also sore.

## 2023-07-17 NOTE — Discharge Instructions (Addendum)
 Return 1 week for suture removal Keep sutures clean, dry, covered, do not get wet for 48 hours Return sooner if you have signs of infection, such as spreading redness, pain, tenderness, swelling, or purulent discharge

## 2023-07-24 ENCOUNTER — Ambulatory Visit (HOSPITAL_COMMUNITY)
Admission: RE | Admit: 2023-07-24 | Discharge: 2023-07-24 | Disposition: A | Discharge status: Home / Self Care | Payer: Self-pay | Source: Ambulatory Visit

## 2023-07-24 DIAGNOSIS — Z4802 Encounter for removal of sutures: Secondary | ICD-10-CM

## 2023-07-24 NOTE — ED Triage Notes (Signed)
 Pt reports to UC for suture removal from 1/5. States sutures are itchy but no other sxs

## 2023-11-29 ENCOUNTER — Encounter (HOSPITAL_COMMUNITY): Payer: Self-pay

## 2023-11-29 ENCOUNTER — Ambulatory Visit (HOSPITAL_COMMUNITY)
Admission: EM | Admit: 2023-11-29 | Discharge: 2023-11-29 | Disposition: A | Discharge status: Home / Self Care | Payer: Self-pay | Attending: Nurse Practitioner | Admitting: Nurse Practitioner

## 2023-11-29 DIAGNOSIS — J069 Acute upper respiratory infection, unspecified: Secondary | ICD-10-CM

## 2023-11-29 LAB — POC COVID19/FLU A&B COMBO
Covid Antigen, POC: NEGATIVE
Influenza A Antigen, POC: NEGATIVE
Influenza B Antigen, POC: NEGATIVE

## 2023-11-29 MED ORDER — PSEUDOEPH-BROMPHEN-DM 30-2-10 MG/5ML PO SYRP: 10.0000 mL | ORAL_SOLUTION | Freq: Four times a day (QID) | ORAL | 0 refills | Status: AC | PRN

## 2023-11-29 NOTE — Discharge Instructions (Signed)

## 2023-11-29 NOTE — ED Triage Notes (Addendum)
 Pt c/o cough, congestion, decrease appetite, RUQ pain, weakness, headache, and fatigue x2 days. Took cold meds with no relief. Needs a work note.

## 2023-11-29 NOTE — ED Provider Notes (Signed)
 [facility]    CSN: [id number] Arrival date & time: 11/29/23  1232      History   Chief Complaint Chief Complaint  Patient presents with   Cough    HPI Phillip Granda. is a 38 y.o. male.   Phillip Fetch. is a 38 y.o. male that presents with headache, congestion, fever, diarrhea, and weakness. He reports feeling unwell for the past 2 days and has been unable to eat during this time. The patient describes experiencing subjective fever with associated chills and body aches. He also reports runny nose, sneezing, coughing with productive sputum, and mild shortness of breath. Phillip Wallace denies any wheezing. He mentions having a sore throat earlier, but it is not bothering him at the moment of the visit. Despite decreased appetite, he states he is able to stay hydrated. He has tried OTC cold medications with no relief in his symptoms. He mentions that his daughter was sick a few days ago and believes he contracted the illness from her. The patient is a smoker.  The following portions of the patient's history were reviewed and updated as appropriate: allergies, current medications, past family history, past medical history, past social history, past surgical history, and problem list.      History reviewed. No pertinent past medical history.  There are no active problems to display for this patient.   Past Surgical History:  Procedure Laterality Date   OPEN REDUCTION INTERNAL FIXATION (ORIF) METACARPAL Right 02/04/2021   Procedure: OPEN REDUCTION INTERNAL FIXATION (ORIF) RIGHT SMALL FINGER METACARPAL FRACTURE;  Surgeon: Florida Hurter, MD;  Location: [facility];  Service: Orthopedics;  Laterality: Right;  MAC WITH BLOCK       Home Medications    Prior to Admission medications   Medication Sig Start Date End Date Taking? Authorizing Provider  brompheniramine-pseudoephedrine-DM 30-2-10 MG/5ML syrup Take 10 mLs by mouth every 6 (six) hours as  needed (cough and congestion). 11/29/23  Yes Maryruth Sol, FNP    Family History History reviewed. No pertinent family history.  Social History Social History   Tobacco Use   Smoking status: Every Day    Current packs/day: 0.50    Types: Cigarettes   Smokeless tobacco: Never  Substance Use Topics   Alcohol use: Yes   Drug use: Yes    Types: Marijuana    Comment: last pm     Allergies   Patient has no known allergies.   Review of Systems Review of Systems  Constitutional:  Positive for appetite change, chills, diaphoresis, fatigue and fever (subjetive).  HENT:  Positive for congestion, rhinorrhea and sneezing (a little bit). Negative for sore throat.   Respiratory:  Positive for cough (productive) and shortness of breath (a little bit).   Gastrointestinal:  Positive for diarrhea and nausea. Negative for vomiting.  Musculoskeletal:  Positive for myalgias.  Neurological:  Positive for weakness (generalized).  All other systems reviewed and are negative.    Physical Exam Triage Vital Signs ED Triage Vitals [11/29/23 1353]  Encounter Vitals Group     BP (!) 148/95     Systolic BP Percentile      Diastolic BP Percentile      Pulse Rate 69     Resp 18     Temp 98.6 F (37 C)     Temp Source Oral     SpO2 98 %     Weight      Height      Head  Circumference      Peak Flow      Pain Score 0     Pain Loc      Pain Education      Exclude from Growth Chart    No data found.  Updated Vital Signs BP (!) 148/95 (BP Location: Right Arm)   Pulse 69   Temp 98.6 F (37 C) (Oral)   Resp 18   SpO2 98%   Visual Acuity Right Eye Distance:   Left Eye Distance:   Bilateral Distance:    Right Eye Near:   Left Eye Near:    Bilateral Near:     Physical Exam Vitals and nursing note reviewed.  Constitutional:      General: He is awake. He is not in acute distress.    Appearance: Normal appearance. He is well-developed. He is not ill-appearing, toxic-appearing  or diaphoretic.  HENT:     Head: Normocephalic.     Nose: Congestion present.     Mouth/Throat:     Mouth: Mucous membranes are moist.     Pharynx: Oropharynx is clear. Uvula midline. No pharyngeal swelling, oropharyngeal exudate, posterior oropharyngeal erythema or uvula swelling.  Eyes:     Conjunctiva/sclera: Conjunctivae normal.  Cardiovascular:     Rate and Rhythm: Normal rate and regular rhythm.     Heart sounds: Normal heart sounds.  Pulmonary:     Effort: Pulmonary effort is normal. No tachypnea or accessory muscle usage.     Breath sounds: Normal breath sounds and air entry. No decreased air movement or transmitted upper airway sounds. No decreased breath sounds, wheezing or rhonchi.  Musculoskeletal:        General: Normal range of motion.     Cervical back: Full passive range of motion without pain, normal range of motion and neck supple.  Lymphadenopathy:     Cervical: No cervical adenopathy.  Skin:    General: Skin is warm and dry.  Neurological:     General: No focal deficit present.     Mental Status: He is alert and oriented to person, place, and time.  Psychiatric:        Behavior: Behavior is cooperative.      UC Treatments / Results  Labs (all labs ordered are listed, but only abnormal results are displayed) Labs Reviewed  POC COVID19/FLU A&B COMBO - Normal    EKG   Radiology No results found.  Procedures Procedures (including critical care time)  Medications Ordered in UC Medications - No data to display  Initial Impression / Assessment and Plan / UC Course  I have reviewed the triage vital signs and the nursing notes.  Pertinent labs & imaging results that were available during my care of the patient were reviewed by me and considered in my medical decision making (see chart for details).    38 year old male presenting with headache, congestion, diarrhea, subjective fever, and weakness for the past two days. He reports associated chills,  body aches, runny nose, sneezing, productive cough, and mild shortness of breath without wheezing. He is able to stay hydrated despite poor appetite. Symptoms began after recent exposure to his sick daughter. He has tried OTC medications without relief. On exam, he is afebrile and nontoxic. Flu and COVID tests are negative. Clinical presentation is consistent with an acute viral upper respiratory infection. Supportive care measures and follow-up instructions were reviewed.  Today's evaluation has revealed no signs of a dangerous process. Discussed diagnosis with patient and/or guardian. Patient and/or guardian  aware of their diagnosis, possible red flag symptoms to watch out for and need for close follow up. Patient and/or guardian understands verbal and written discharge instructions. Patient and/or guardian comfortable with plan and disposition.  Patient and/or guardian has a clear mental status at this time, good insight into illness (after discussion and teaching) and has clear judgment to make decisions regarding their care  Documentation was completed with the aid of voice recognition software. Transcription may contain typographical errors. Final Clinical Impressions(s) / UC Diagnoses   Final diagnoses:  Viral URI with cough     Discharge Instructions      Your symptoms are most likely caused by a respiratory infection, which affects areas like your nose, throat, or lungs. This type of infection is usually caused by a virus. Since your illness is caused by a virus, antibiotics won't help because they only treat infections caused by bacteria.  Take the medications that were prescribed to you as directed. If you have a fever, headache, or body aches, you can also take Tylenol  or ibuprofen to help you feel more comfortable. Be sure to drink plenty of fluids to stay hydrated--aim for enough to keep your urine a pale yellow color. This will also help to thin mucus and make it easier to clear from  your body.   Using a cool mist humidifier at home to keep humidity levels above 50% can be helpful. You can also inhale steam for 10 to 15 minutes, 3 to 4 times a day. This can be done by sitting in the bathroom with a hot shower running, or by using over-the-counter vapor shower tablets to help with nasal congestion. Try to avoid cool or dry air as much as possible. When you sleep, keep your head elevated to help reduce post-nasal drainage. Be sure to get enough rest every night to support your recovery. Don't forget to replace your toothbrush once you start feeling better.   It's normal for a cough to linger for several weeks after a respiratory illness, even after other symptoms have resolved. This happens because the airways remain irritated and take time to fully heal. As long as the cough gradually improves and there are no new concerning symptoms, this is part of the normal recovery process.  If your symptoms get worse or if you develop any new or concerning symptoms, go to the emergency room right away. If you're not feeling better in a few days, follow up with your primary care provider.          ED Prescriptions     Medication Sig Dispense Auth. Provider   brompheniramine-pseudoephedrine-DM 30-2-10 MG/5ML syrup Take 10 mLs by mouth every 6 (six) hours as needed (cough and congestion). 120 mL Maryruth Sol, FNP      PDMP not reviewed this encounter.   Maryruth Sol, [place] 11/29/23 1527
